# Patient Record
Sex: Female | Born: 1960 | Race: White | Hispanic: No | Smoking: Former smoker
Health system: Southern US, Community
[De-identification: ages and names within clinical notes are randomized; demographics above are authoritative.]

## PROBLEM LIST (undated history)

## (undated) VITALS — BP 115/80 | HR 106 | Temp 97.9°F | Resp 16 | Ht 64.0 in | Wt 154.0 lb

## (undated) DIAGNOSIS — A6920 Lyme disease, unspecified: Secondary | ICD-10-CM

## (undated) DIAGNOSIS — F988 Other specified behavioral and emotional disorders with onset usually occurring in childhood and adolescence: Secondary | ICD-10-CM

## (undated) DIAGNOSIS — Z808 Family history of malignant neoplasm of other organs or systems: Secondary | ICD-10-CM

## (undated) DIAGNOSIS — Z8042 Family history of malignant neoplasm of prostate: Secondary | ICD-10-CM

## (undated) DIAGNOSIS — F1010 Alcohol abuse, uncomplicated: Secondary | ICD-10-CM

## (undated) DIAGNOSIS — F319 Bipolar disorder, unspecified: Secondary | ICD-10-CM

## (undated) DIAGNOSIS — F329 Major depressive disorder, single episode, unspecified: Secondary | ICD-10-CM

## (undated) DIAGNOSIS — J329 Chronic sinusitis, unspecified: Secondary | ICD-10-CM

## (undated) DIAGNOSIS — F32A Depression, unspecified: Secondary | ICD-10-CM

## (undated) DIAGNOSIS — R7989 Other specified abnormal findings of blood chemistry: Secondary | ICD-10-CM

## (undated) DIAGNOSIS — D510 Vitamin B12 deficiency anemia due to intrinsic factor deficiency: Secondary | ICD-10-CM

## (undated) DIAGNOSIS — Z803 Family history of malignant neoplasm of breast: Secondary | ICD-10-CM

## (undated) DIAGNOSIS — Z800 Family history of malignant neoplasm of digestive organs: Secondary | ICD-10-CM

## (undated) DIAGNOSIS — Z780 Asymptomatic menopausal state: Secondary | ICD-10-CM

## (undated) HISTORY — PX: LAPAROSCOPIC APPENDECTOMY: SHX408

## (undated) HISTORY — PX: CHOLECYSTECTOMY: SHX55

## (undated) HISTORY — PX: KNEE SURGERY: SHX244

## (undated) HISTORY — DX: Family history of malignant neoplasm of breast: Z80.3

## (undated) HISTORY — DX: Family history of malignant neoplasm of other organs or systems: Z80.8

## (undated) HISTORY — DX: Family history of malignant neoplasm of prostate: Z80.42

## (undated) HISTORY — DX: Family history of malignant neoplasm of digestive organs: Z80.0

## (undated) HISTORY — DX: Bipolar disorder, unspecified: F31.9

---

## 2012-01-15 ENCOUNTER — Encounter (HOSPITAL_COMMUNITY): Payer: Self-pay | Admitting: *Deleted

## 2012-01-15 ENCOUNTER — Emergency Department (HOSPITAL_COMMUNITY)
Admission: EM | Admit: 2012-01-15 | Discharge: 2012-01-16 | Disposition: A | Discharge status: Other Acute Inpatient Hospital | Payer: BC Managed Care – HMO | Attending: Emergency Medicine | Admitting: Emergency Medicine

## 2012-01-15 DIAGNOSIS — F1010 Alcohol abuse, uncomplicated: Secondary | ICD-10-CM | POA: Insufficient documentation

## 2012-01-15 HISTORY — DX: Depression, unspecified: F32.A

## 2012-01-15 HISTORY — DX: Major depressive disorder, single episode, unspecified: F32.9

## 2012-01-15 HISTORY — DX: Other specified behavioral and emotional disorders with onset usually occurring in childhood and adolescence: F98.8

## 2012-01-15 HISTORY — DX: Lyme disease, unspecified: A69.20

## 2012-01-15 HISTORY — DX: Other specified abnormal findings of blood chemistry: R79.89

## 2012-01-15 HISTORY — DX: Vitamin B12 deficiency anemia due to intrinsic factor deficiency: D51.0

## 2012-01-15 HISTORY — DX: Alcohol abuse, uncomplicated: F10.10

## 2012-01-15 LAB — URINALYSIS, ROUTINE W REFLEX MICROSCOPIC
Ketones, ur: NEGATIVE mg/dL
Leukocytes, UA: NEGATIVE
Nitrite: NEGATIVE
Protein, ur: NEGATIVE mg/dL
Urobilinogen, UA: 0.2 mg/dL (ref 0.0–1.0)
pH: 6 (ref 5.0–8.0)

## 2012-01-15 LAB — CBC WITH DIFFERENTIAL/PLATELET
Basophils Absolute: 0 10*3/uL (ref 0.0–0.1)
Basophils Relative: 0 % (ref 0–1)
Eosinophils Absolute: 0.3 10*3/uL (ref 0.0–0.7)
Hemoglobin: 14.8 g/dL (ref 12.0–15.0)
MCH: 35.3 pg — ABNORMAL HIGH (ref 26.0–34.0)
MCHC: 35.7 g/dL (ref 30.0–36.0)
Neutro Abs: 5.4 10*3/uL (ref 1.7–7.7)
Neutrophils Relative %: 59 % (ref 43–77)
Platelets: 266 10*3/uL (ref 150–400)
RDW: 12.2 % (ref 11.5–15.5)

## 2012-01-15 LAB — COMPREHENSIVE METABOLIC PANEL
AST: 34 U/L (ref 0–37)
Albumin: 4.1 g/dL (ref 3.5–5.2)
Alkaline Phosphatase: 75 U/L (ref 39–117)
Chloride: 99 mEq/L (ref 96–112)
Potassium: 3.6 mEq/L (ref 3.5–5.1)
Total Bilirubin: 0.3 mg/dL (ref 0.3–1.2)
Total Protein: 7.5 g/dL (ref 6.0–8.3)

## 2012-01-15 LAB — ETHANOL: Alcohol, Ethyl (B): <11 mg/dL (ref 0–11)

## 2012-01-15 LAB — POCT PREGNANCY, URINE: Preg Test, Ur: NEGATIVE

## 2012-01-15 MED ORDER — ONDANSETRON HCL 4 MG PO TABS: 4.0000 mg | ORAL_TABLET | Freq: Three times a day (TID) | ORAL | Status: DC | PRN

## 2012-01-15 MED ORDER — NICOTINE 21 MG/24HR TD PT24
21.0000 mg | MEDICATED_PATCH | Freq: Every day | TRANSDERMAL | Status: DC
  Administered 2012-01-16: 21 mg via TRANSDERMAL
  Filled 2012-01-15: qty 1

## 2012-01-15 MED ORDER — IBUPROFEN 200 MG PO TABS
600.0000 mg | ORAL_TABLET | Freq: Three times a day (TID) | ORAL | Status: DC | PRN
  Administered 2012-01-16: 600 mg via ORAL
  Filled 2012-01-15: qty 3

## 2012-01-15 MED ORDER — ALUM & MAG HYDROXIDE-SIMETH 200-200-20 MG/5ML PO SUSP: 30.0000 mL | ORAL | Status: DC | PRN

## 2012-01-15 NOTE — ED Notes (Signed)
Pt states "here for detox from ETOH, drink 4-5 beers a day for a long time"

## 2012-01-15 NOTE — ED Provider Notes (Signed)
History     CSN: [id number]  Arrival date & time 01/15/12  1643   First MD Initiated Contact with Patient 01/15/12 [phone]      Chief Complaint  Patient presents with  . V70.1    (Consider location/radiation/quality/duration/timing/severity/associated sxs/prior treatment) The history is provided by the patient.   Patient here requesting detox from alcohol. Last drink was 24 hours ago. Denies abdominal pain or change in stools. No suicidal ideations. Denies any hallucinations. Has never been in rehabilitation before in the past. No other drug use. Nothing makes her symptoms better worse. No medications used prior to arrival Past Medical History  Diagnosis Date  . Depression   . Elevated testosterone level in female   . Pernicious anemia   . ADD (attention deficit disorder)   . ETOH abuse   . Lyme disease     Past Surgical History  Procedure Date  . Cholecystectomy   . Cesarean section   . Knee surgery     left    No family history on file.  History  Substance Use Topics  . Smoking status: Passive Smoker  . Smokeless tobacco: Not on file  . Alcohol Use: Not on file    OB History    Grav Para Term Preterm Abortions TAB SAB Ect Mult Living                  Review of Systems  All other systems reviewed and are negative.    Allergies  Penicillins  Home Medications   Current Outpatient Rx  Name Route Sig Dispense Refill  . ESCITALOPRAM OXALATE 10 MG PO TABS Oral Take 10 mg by mouth daily.    Marland Kitchen LISDEXAMFETAMINE DIMESYLATE 40 MG PO CAPS Oral Take 40 mg by mouth every morning.    Marland Kitchen VITAMIN B-12 1000 MCG PO TABS Oral Take 1,000 mcg by mouth.      BP 131/75  Pulse 90  Temp 98.3 F (36.8 C)  Resp 20  Wt 150 lb (68.04 kg)  SpO2 99%  Physical Exam  Nursing note and vitals reviewed. Constitutional: She is oriented to person, place, and time. Vital signs are normal. She appears well-developed and well-nourished.  Non-toxic appearance. No distress.  HENT:    Head: Normocephalic and atraumatic.  Eyes: Conjunctivae, EOM and lids are normal. Pupils are equal, round, and reactive to light.  Neck: Normal range of motion. Neck supple. No tracheal deviation present. No mass present.  Cardiovascular: Normal rate, regular rhythm and normal heart sounds.  Exam reveals no gallop.   No murmur heard. Pulmonary/Chest: Effort normal and breath sounds normal. No stridor. No respiratory distress. She has no decreased breath sounds. She has no wheezes. She has no rhonchi. She has no rales.  Abdominal: Soft. Normal appearance and bowel sounds are normal. She exhibits no distension. There is no tenderness. There is no rebound and no CVA tenderness.  Musculoskeletal: Normal range of motion. She exhibits no edema and no tenderness.  Neurological: She is alert and oriented to person, place, and time. She has normal strength. No cranial nerve deficit or sensory deficit. GCS eye subscore is 4. GCS verbal subscore is 5. GCS motor subscore is 6.  Skin: Skin is warm and dry. No abrasion and no rash noted.  Psychiatric: She has a normal mood and affect. Her speech is normal and behavior is normal. Thought content is not paranoid. She expresses no homicidal and no suicidal ideation.    ED Course  Procedures (including critical  care time)  Labs Reviewed  CBC WITH DIFFERENTIAL - Abnormal; Notable for the following:    MCH 35.3 (*)     All other components within normal limits  COMPREHENSIVE METABOLIC PANEL - Abnormal; Notable for the following:    Glucose, Bld 115 (*)     ALT 37 (*)     All other components within normal limits  URINALYSIS, ROUTINE W REFLEX MICROSCOPIC  ETHANOL  POCT PREGNANCY, URINE   No results found.   No diagnosis found.    MDM  Patient has been medically cleared at this time and will be evaluated by the act team        Toy Baker, MD 01/15/12 [phone]

## 2012-01-15 NOTE — BH Assessment (Signed)
Assessment Note   [name] is an 51 y.o. female. Pt reports that she is in town on vacation and her family performed an intervention today to confront her about her drinking.  Pt has multiple stressors: pending divorce, recent job loss.  Pt admits her drinking has increased significantly over the past month, currently 10 beers daily plus vodka.  Pt reports withdrawal of significant sweats.  Pt has not prior substance abuse treatment.  Pt also reports depression, currently sees therapist in [place].  Pt denies current SI, although while drinking at beach alone last week pt admits to some fleeting SI.  Pt denies HI/AV as well.  Axis I: Alcohol dependence Axis II: Deferred Axis III:  Past Medical History  Diagnosis Date  . Depression   . Elevated testosterone level in female   . Pernicious anemia   . ADD (attention deficit disorder)   . ETOH abuse   . Lyme disease    Axis IV: economic problems and problems with primary support group Axis V: 51-60 moderate symptoms  Past Medical History:  Past Medical History  Diagnosis Date  . Depression   . Elevated testosterone level in female   . Pernicious anemia   . ADD (attention deficit disorder)   . ETOH abuse   . Lyme disease     Past Surgical History  Procedure Date  . Cholecystectomy   . Cesarean section   . Knee surgery     left    Family History: No family history on file.  Social History:  reports that she has been passively smoking.  She does not have any smokeless tobacco history on file. She reports that she does not use illicit drugs. Her alcohol history not on file.  Additional Social History:  Alcohol / Drug Use Pain Medications: Pt denies Prescriptions: Pt denies Over the Counter: Pt denies History of alcohol / drug use?: Yes Longest period of sobriety (when/how long): none recent Negative Consequences of Use: Financial;Personal relationships Withdrawal Symptoms: Sweats Substance #1 Name of Substance  1: beer/liquor 1 - Age of First Use: 15 1 - Amount (size/oz): 10 beers/ 1/2 bottle vodka 1 - Frequency: daily 1 - Duration: Past month.  Pt reports 10 year history of daily drinking but has been drinking the above amount for the past month.  Prior to that was drinking less per day. 1 - Last Use / Amount: 7/27, 4 24 oz beers, 1 drink  CIWA: CIWA-Ar BP: 118/80 mmHg Pulse Rate: 74  Nausea and Vomiting: no nausea and no vomiting Tactile Disturbances: none Tremor: no tremor Auditory Disturbances: not present Paroxysmal Sweats: beads of sweat obvious on forehead Visual Disturbances: not present Anxiety: no anxiety, at ease Headache, Fullness in Head: mild Agitation: somewhat more than normal activity Orientation and Clouding of Sensorium: oriented and can do serial additions CIWA-Ar Total: 7  COWS:    Allergies:  Allergies  Allergen Reactions  . Penicillins Anaphylaxis    Home Medications:  (Not in a hospital admission)  OB/GYN Status:  No LMP recorded. Patient is not currently having periods (Reason: Perimenopausal).  General Assessment Data Location of Assessment: [facility] ACT Assessment: Yes Living Arrangements: Spouse/significant other;Children;Non-relatives/Friends Can pt return to current living arrangement?: Yes  Education Status Is patient currently in school?: No  Risk to self Suicidal Ideation: No-Not Currently/Within Last 6 Months (Pt reports SI, ideation only, last week at beach, no plan.) Suicidal Intent: No Is patient at risk for suicide?: No Suicidal Plan?: No Access to Means:  No What has been your use of drugs/alcohol within the last 12 months?: current daily drinker Previous Attempts/Gestures: No Intentional Self Injurious Behavior: None Family Suicide History: Yes (mother's side) Recent stressful life event(s): Divorce;Job Loss;Financial Problems Persecutory voices/beliefs?: No Depression: Yes Depression Symptoms:  Despondent;Isolating;Fatigue;Guilt;Loss of interest in usual pleasures;Feeling worthless/self pity;Feeling angry/irritable Substance abuse history and/or treatment for substance abuse?: Yes Suicide prevention information given to non-admitted patients: Not applicable  Risk to Others Homicidal Ideation: No Thoughts of Harm to Others: No Current Homicidal Intent: No Current Homicidal Plan: No Access to Homicidal Means: No History of harm to others?: No Assessment of Violence: None Noted Does patient have access to weapons?: No Criminal Charges Pending?: No Does patient have a court date: No  Psychosis Hallucinations: None noted Delusions: None noted  Mental Status Report Appear/Hygiene: Other (Comment) (casual) Eye Contact: Good Motor Activity: Unremarkable Speech: Logical/coherent Level of Consciousness: Alert Mood: Other (Comment) (cooperatiove) Affect: Appropriate to circumstance Anxiety Level: Minimal Thought Processes: Coherent;Relevant Judgement: Unimpaired Orientation: Person;Place;Time;Situation Obsessive Compulsive Thoughts/Behaviors: None  Cognitive Functioning Concentration: Normal Memory: Recent Intact;Remote Intact IQ: Average Insight: Good Impulse Control: Poor Appetite: Poor Weight Loss: 5  Weight Gain: 0  Sleep: No Change Total Hours of Sleep: 5  Vegetative Symptoms: None  ADLScreening [facility]) Patient's cognitive ability adequate to safely complete daily activities?: Yes Patient able to express need for assistance with ADLs?: Yes Independently performs ADLs?: Yes  Abuse/Neglect [facility]) Physical Abuse: Denies Verbal Abuse: Denies Sexual Abuse: Denies  Prior Inpatient Therapy Prior Inpatient Therapy: Yes Prior Therapy Dates: 2007 Prior Therapy Facilty/Provider(s): [facility] Reason for Treatment: psych  Prior Outpatient Therapy Prior Outpatient Therapy: Yes Prior Therapy Dates: current Prior Therapy  Facilty/Provider(s): [facility] Reason for Treatment: depression  ADL Screening (condition at time of admission) Patient's cognitive ability adequate to safely complete daily activities?: Yes Patient able to express need for assistance with ADLs?: Yes Independently performs ADLs?: Yes Weakness of Legs: None Weakness of Arms/Hands: None  Home Assistive Devices/Equipment Home Assistive Devices/Equipment: None    Abuse/Neglect Assessment (Assessment to be complete while patient is alone) Physical Abuse: Denies Verbal Abuse: Denies Sexual Abuse: Denies Exploitation of patient/patient's resources: Denies Self-Neglect: Denies Values / Beliefs Cultural Requests During Hospitalization: None Spiritual Requests During Hospitalization: None   Advance Directives (For Healthcare) Advance Directive: Patient does not have advance directive;Patient would not like information    Additional Information 1:1 In Past 12 Months?: No CIRT Risk: No Elopement Risk: No Does patient have medical clearance?: Yes     Disposition:     On Site Evaluation by:   Reviewed with Physician:     Lorri Frederick 01/15/2012 10:10 PM

## 2012-01-16 ENCOUNTER — Inpatient Hospital Stay (HOSPITAL_COMMUNITY)
Admission: RE | Admit: 2012-01-16 | Discharge: 2012-01-20 | DRG: 750 | Disposition: A | Discharge status: Home / Self Care | Payer: BC Managed Care – HMO | Attending: Psychiatry | Admitting: Psychiatry

## 2012-01-16 ENCOUNTER — Encounter (HOSPITAL_COMMUNITY): Payer: Self-pay | Admitting: *Deleted

## 2012-01-16 DIAGNOSIS — D510 Vitamin B12 deficiency anemia due to intrinsic factor deficiency: Secondary | ICD-10-CM | POA: Diagnosis present

## 2012-01-16 DIAGNOSIS — F988 Other specified behavioral and emotional disorders with onset usually occurring in childhood and adolescence: Secondary | ICD-10-CM | POA: Diagnosis present

## 2012-01-16 DIAGNOSIS — F1020 Alcohol dependence, uncomplicated: Secondary | ICD-10-CM | POA: Diagnosis present

## 2012-01-16 DIAGNOSIS — F10939 Alcohol use, unspecified with withdrawal, unspecified: Principal | ICD-10-CM | POA: Diagnosis not present

## 2012-01-16 DIAGNOSIS — F329 Major depressive disorder, single episode, unspecified: Secondary | ICD-10-CM | POA: Diagnosis present

## 2012-01-16 DIAGNOSIS — F10239 Alcohol dependence with withdrawal, unspecified: Principal | ICD-10-CM | POA: Diagnosis not present

## 2012-01-16 DIAGNOSIS — Z79899 Other long term (current) drug therapy: Secondary | ICD-10-CM

## 2012-01-16 DIAGNOSIS — F3289 Other specified depressive episodes: Secondary | ICD-10-CM | POA: Diagnosis present

## 2012-01-16 LAB — RAPID URINE DRUG SCREEN, HOSP PERFORMED
Amphetamines: NOT DETECTED
Barbiturates: NOT DETECTED
Benzodiazepines: NOT DETECTED
Cocaine: NOT DETECTED

## 2012-01-16 MED ORDER — CHLORDIAZEPOXIDE HCL 25 MG PO CAPS: 25.0000 mg | ORAL_CAPSULE | Freq: Four times a day (QID) | ORAL | Status: AC | PRN

## 2012-01-16 MED ORDER — HYDROXYZINE HCL 25 MG PO TABS: 25.0000 mg | ORAL_TABLET | Freq: Four times a day (QID) | ORAL | Status: AC | PRN

## 2012-01-16 MED ORDER — LOPERAMIDE HCL 2 MG PO CAPS: 2.0000 mg | ORAL_CAPSULE | ORAL | Status: AC | PRN

## 2012-01-16 MED ORDER — CHLORDIAZEPOXIDE HCL 25 MG PO CAPS
25.0000 mg | ORAL_CAPSULE | Freq: Three times a day (TID) | ORAL | Status: AC
  Administered 2012-01-18 (×3): 25 mg via ORAL
  Filled 2012-01-16 (×3): qty 1

## 2012-01-16 MED ORDER — VITAMIN B-1 100 MG PO TABS
100.0000 mg | ORAL_TABLET | Freq: Every day | ORAL | Status: DC
  Administered 2012-01-17 – 2012-01-20 (×4): 100 mg via ORAL
  Filled 2012-01-16 (×6): qty 1

## 2012-01-16 MED ORDER — ALUM & MAG HYDROXIDE-SIMETH 200-200-20 MG/5ML PO SUSP: 30.0000 mL | ORAL | Status: DC | PRN

## 2012-01-16 MED ORDER — CHLORDIAZEPOXIDE HCL 25 MG PO CAPS: 25.0000 mg | ORAL_CAPSULE | Freq: Every day | ORAL | Status: DC

## 2012-01-16 MED ORDER — ACETAMINOPHEN 325 MG PO TABS: 650.0000 mg | ORAL_TABLET | Freq: Four times a day (QID) | ORAL | Status: DC | PRN

## 2012-01-16 MED ORDER — TRAZODONE HCL 100 MG PO TABS
100.0000 mg | ORAL_TABLET | Freq: Every day | ORAL | Status: DC
  Administered 2012-01-16 – 2012-01-19 (×4): 100 mg via ORAL
  Filled 2012-01-16 (×9): qty 1

## 2012-01-16 MED ORDER — NICOTINE 21 MG/24HR TD PT24
21.0000 mg | MEDICATED_PATCH | Freq: Every day | TRANSDERMAL | Status: DC
  Administered 2012-01-17 – 2012-01-20 (×3): 21 mg via TRANSDERMAL
  Filled 2012-01-16 (×7): qty 1

## 2012-01-16 MED ORDER — THIAMINE HCL 100 MG/ML IJ SOLN
100.0000 mg | Freq: Once | INTRAMUSCULAR | Status: AC
  Administered 2012-01-16: 100 mg via INTRAMUSCULAR

## 2012-01-16 MED ORDER — CHLORDIAZEPOXIDE HCL 25 MG PO CAPS
25.0000 mg | ORAL_CAPSULE | Freq: Four times a day (QID) | ORAL | Status: AC
  Administered 2012-01-16 – 2012-01-17 (×6): 25 mg via ORAL
  Filled 2012-01-16 (×6): qty 1

## 2012-01-16 MED ORDER — ADULT MULTIVITAMIN W/MINERALS CH
1.0000 | ORAL_TABLET | Freq: Every day | ORAL | Status: DC
  Administered 2012-01-16 – 2012-01-20 (×5): 1 via ORAL
  Filled 2012-01-16 (×7): qty 1

## 2012-01-16 MED ORDER — MAGNESIUM HYDROXIDE 400 MG/5ML PO SUSP: 30.0000 mL | Freq: Every day | ORAL | Status: DC | PRN

## 2012-01-16 MED ORDER — CHLORDIAZEPOXIDE HCL 25 MG PO CAPS: 25.0000 mg | ORAL_CAPSULE | Freq: Once | ORAL | Status: DC

## 2012-01-16 MED ORDER — ONDANSETRON 4 MG PO TBDP: 4.0000 mg | ORAL_TABLET | Freq: Four times a day (QID) | ORAL | Status: AC | PRN

## 2012-01-16 MED ORDER — CHLORDIAZEPOXIDE HCL 25 MG PO CAPS
25.0000 mg | ORAL_CAPSULE | ORAL | Status: DC
  Administered 2012-01-19: 25 mg via ORAL
  Filled 2012-01-16: qty 1

## 2012-01-16 NOTE — ED Notes (Signed)
Patient states she is from [place]; willing to seek treatment there; sees a therapist and has a PCP who she sees frequently; pt wants helps and desires to seek therapy

## 2012-01-16 NOTE — ED Notes (Signed)
Patient transferred to behavorial health via security; report called to adult unit; pt stable at time of transfer; all paperwork and belongings with patient;

## 2012-01-16 NOTE — BHH Counselor (Signed)
Patient has been accepted by Dr. Theotis Barrio to the services of Dr. Koren Shiver pending bed availability.

## 2012-01-16 NOTE — ED Provider Notes (Addendum)
Patient alert Glasgow Coma Score 15 ambulate without difficulty pleasant and cooperative stable for transfer to behavioral health hospital  Doug Sou, MD 01/16/12 1605and  Doug Sou, MD 01/16/12 [phone]

## 2012-01-16 NOTE — ED Notes (Signed)
Patient is resting comfortably. 

## 2012-01-16 NOTE — Progress Notes (Addendum)
Admission Note:   Patient's first admission to [facility].   Denied SI and HI.  Denied A/V hallucinations.   Denied pain at this time.  Stated she does feel anxiety and has occasional sweating episodes.  Stated her husband left her 2 years ago.   Has two sons, age 51 and 98 yrs old.   Oldest son goes to [employer] , [place].  Youngest son goes to [employer].   Husband does provide insurance and support funds.  Contributes some monies to sons'  Education.  Patient denied using drugs.  Stated she has been drinking approximately 10 beers and one bottle vodka daily.   Has her own home in [place], [place] and came to visit sister in [place] who encourage patient to come to [facility] for help.  Stated she lost her job 3 years ago and is over-qualified for work.  Made six figure salary in [employer].  Older son attempted suicide in the past year and had major surgery 2 years ago.  Younger son has health issues. Stated she takes lexapro 10 mg daily, but not in the past month.  Also takes estrogen but not in the past month.  Smokes approximately one pack cigarettes daily recently, feels she may need nicotine patch.  Has small white circles on legs/arm caused by sun.  Uncle has history of alcohol abuse.  Stated she takes B12 injections monthly, last injection 3 weeks ago.  Stated her OBGYN told her she is not fertile because of pituary gland problems.    [address] [employer] has clothes and phone. Patient was given dinner.  Oriented to unit.  Patient has been cooperative and pleasant.

## 2012-01-17 DIAGNOSIS — F1020 Alcohol dependence, uncomplicated: Secondary | ICD-10-CM | POA: Diagnosis present

## 2012-01-17 DIAGNOSIS — F329 Major depressive disorder, single episode, unspecified: Secondary | ICD-10-CM

## 2012-01-17 MED ORDER — ESCITALOPRAM OXALATE 10 MG PO TABS
10.0000 mg | ORAL_TABLET | Freq: Every day | ORAL | Status: DC
  Administered 2012-01-17 – 2012-01-20 (×4): 10 mg via ORAL
  Filled 2012-01-17 (×6): qty 1

## 2012-01-17 NOTE — Progress Notes (Signed)
BHH Group Notes:  (Counselor/Nursing/MHT/Case Management/Adjunct)  01/17/2012 3:27 PM  Type of Therapy:  Psychoeducational Skills  Participation Level:  Active  Participation Quality:  Appropriate, Attentive and Sharing  Affect:  Appropriate  Cognitive:  Alert, Appropriate and Oriented  Insight:  Good  Engagement in Group:  Good  Engagement in Therapy:  n/a  Modes of Intervention:  Activity, Education, Problem-solving, Socialization and Support  Summary of Progress/Problems: [name] attended Psychoeducational group on labels. [name] was late due to consult and did not participate in an activity labeling self and peers. [name] was active while group discussed what labels are, how we use them, and listed positive and negative labels they have used or been called. [name] stated she thinks and does too much for others and does not take care of herself. [name] was given a homework assignment to list 10 words she has been labeled to find the reality of the situation/label.    Wandra Scot 01/17/2012, 3:27 PM

## 2012-01-17 NOTE — Progress Notes (Signed)
BHH Group Notes:  (Counselor/Nursing/MHT/Case Management/Adjunct)  01/17/2012 4:48 PM  Type of Therapy:  Group Therapy at 1:15 to 2:30  Participation Level:  Active  Participation Quality:  Appropriate, Attentive and Sharing  Affect:  Appropriate  Cognitive:  Alert, Appropriate and Oriented  Insight:  Good  Engagement in Group:  Good  Engagement in Therapy:  Limited  Modes of Intervention:  Education, Paediatric nurse of Progress/Problems:Patient attended group presentation by Interior and spatial designer of  [employer] [place] ([employer]). Lanora Manis [facility]) was attentive and appropriate during session.  She shared how this same type service would have helped her immensely to perhaps avoid hospitalization.    Clide Dales

## 2012-01-17 NOTE — BH Assessment (Signed)
Case Management: Patient has home to return to in [place], [place], has transportation and access to medications. Patient admits that she would benefit from SA treatment. Writer called and spoke with CD-IOP facilitator, Charmian Muff, and requested that she speak with patient on 01/18/2012 to assess for readiness for CD-IOP services. Patient received Recovery Workbook in after-care planning group this AM. No further case management needs voiced at this time. Joice Lofts RN MS EdS 01/17/2012  3:55 PM

## 2012-01-17 NOTE — H&P (Signed)
Psychiatric Admission Assessment Adult  Patient Identification:  [name] Date of Evaluation:  01/17/2012 Chief Complaint:  Alcohol Dependence History of Present Illness: Patient states she is here voluntarily after she had an epiphany that she needed help with her alcohol problem.  She has been drinking 18 beers a day and a bottle of vodka a day. For the last 2 months.  She denies other drugs or substances of abuse. She states she started having increased drinking after her husband left her 2 years ago. She lives in [place] and has family in this area.  She will return to [place] upon discharge.  Her W/D symptoms are minimal.  She states her mood symptoms are depression severe with no psychosis, no SI/HI, no AH/VH.  No anxiety, just feels that she is ready to get on with her life.  Past Medical History:   Past Medical History  Diagnosis Date  . Depression   . Elevated testosterone level in female   . Pernicious anemia   . ADD (attention deficit disorder)   . ETOH abuse   . Lyme disease    None. Allergies:   Allergies  Allergen Reactions  . Penicillins Anaphylaxis   PTA Medications: Prescriptions prior to admission  Medication Sig Dispense Refill  . escitalopram (LEXAPRO) 10 MG tablet Take 10 mg by mouth daily.      Marland Kitchen lisdexamfetamine (VYVANSE) 40 MG capsule Take 40 mg by mouth every morning.      . vitamin B-12 (CYANOCOBALAMIN) 1000 MCG tablet Take 1,000 mcg by mouth.        Previous Psychotropic Medications:  Medication/Dose   Lexapro    Vyvanse   Angelique (HRT)   B12 injections q 3 weeks          Substance Abuse History in the last 12 months: Substance Age of 1st Use Last Use Amount Specific Type  Nicotine      Alcohol   see HPI    Cannabis      Opiates      Cocaine      Methamphetamines      LSD      Ecstasy      Benzodiazepines      Caffeine      Inhalants      Others:                         Consequences of Substance Abuse: Medical  Consequences:  None  Social History: Current Place of Residence:   Place of Birth:   Family Members: Marital Status:  Divorced Children:  Sons:  Daughters: Relationships: Education:  BS Educational Problems/Performance: Religious Beliefs/Practices: History of Abuse (Emotional/Phsycial/Sexual) Occupational Experiences; Military History:  None. Legal History: Hobbies/Interests:  Family History:  History reviewed. No pertinent family history. ROS: Negative with the exception of the HPI. PE: Completed by the MD in the ED. Mental Status Examination/Evaluation: Objective:  Appearance: Fairly Groomed  Patent attorney::  Good  Speech:  Clear and Coherent  Volume:  Normal  Mood:  Depressed  Affect:  Appropriate  Thought Process:  Coherent, Goal Directed and Intact  Orientation:  Full  Thought Content:  WDL  Suicidal Thoughts:  No  Homicidal Thoughts:  No  Memory:  Immediate;   Good  Judgement:  Good  Insight:  Present  Psychomotor Activity:  Normal  Concentration:  Good  Recall:  Good  Akathisia:  No  Handed:  Right  AIMS (if indicated):     Assets:  Communication Skills  Sleep:  Number of Hours: 6.5     Laboratory/X-Ray Psychological Evaluation(s)      Assessment:    AXIS I:  Alcohol dependence with MDD AXIS II:  Deferred AXIS III:   Past Medical History  Diagnosis Date  . Depression   . Elevated testosterone level in female   . Pernicious anemia   . ADD (attention deficit disorder)   . ETOH abuse   . Lyme disease    AXIS IV:  problems with primary support group AXIS V:  51-60 moderate symptoms  Treatment Plan: 1. Admit for stabilization and crisis management. 2. Continue Librium protocol. 3. Consider SSRI.   Treatment Plan Summary: 1. Admit for crisis management and stabilization. 2. Detox with standard (Librium/Clonidine) protocol. 3. Treat health problems as indicated. 4. Develop treatment plan to decrease risk of relapse upon discharge and the  need for readmission. 5. Psycho-social education regarding relapse prevention and self care. 6. Health care follow up as needed for medical problems.  Current Medications:  Current Facility-Administered Medications  Medication Dose Route Frequency Provider Last Rate Last Dose  . acetaminophen (TYLENOL) tablet 650 mg  650 mg Oral Q6H PRN Sanjuana Kava, NP      . alum & mag hydroxide-simeth (MAALOX/MYLANTA) 200-200-20 MG/5ML suspension 30 mL  30 mL Oral Q4H PRN Sanjuana Kava, NP      . chlordiazePOXIDE (LIBRIUM) capsule 25 mg  25 mg Oral Q6H PRN Sanjuana Kava, NP      . chlordiazePOXIDE (LIBRIUM) capsule 25 mg  25 mg Oral Once Sanjuana Kava, NP      . chlordiazePOXIDE (LIBRIUM) capsule 25 mg  25 mg Oral QID Sanjuana Kava, NP   25 mg at 01/17/12 1504   Followed by  . chlordiazePOXIDE (LIBRIUM) capsule 25 mg  25 mg Oral TID Sanjuana Kava, NP       Followed by  . chlordiazePOXIDE (LIBRIUM) capsule 25 mg  25 mg Oral BH-qamhs Sanjuana Kava, NP       Followed by  . chlordiazePOXIDE (LIBRIUM) capsule 25 mg  25 mg Oral Daily Sanjuana Kava, NP      . hydrOXYzine (ATARAX/VISTARIL) tablet 25 mg  25 mg Oral Q6H PRN Sanjuana Kava, NP      . loperamide (IMODIUM) capsule 2-4 mg  2-4 mg Oral PRN Sanjuana Kava, NP      . magnesium hydroxide (MILK OF MAGNESIA) suspension 30 mL  30 mL Oral Daily PRN Sanjuana Kava, NP      . multivitamin with minerals tablet 1 tablet  1 tablet Oral Daily Sanjuana Kava, NP   1 tablet at 01/17/12 0940  . nicotine (NICODERM CQ - dosed in mg/24 hours) patch 21 mg  21 mg Transdermal Q0600 Sanjuana Kava, NP   21 mg at 01/17/12 1610  . ondansetron (ZOFRAN-ODT) disintegrating tablet 4 mg  4 mg Oral Q6H PRN Sanjuana Kava, NP      . thiamine (B-1) injection 100 mg  100 mg Intramuscular Once Sanjuana Kava, NP   100 mg at 01/16/12 1832  . thiamine (VITAMIN B-1) tablet 100 mg  100 mg Oral Daily Sanjuana Kava, NP   100 mg at 01/17/12 0941  . traZODone (DESYREL) tablet 100 mg  100 mg Oral QHS  Sanjuana Kava, NP   100 mg at 01/16/12 2159   Facility-Administered Medications Ordered in Other Encounters  Medication Dose Route Frequency Provider Last Rate  Last Dose  . DISCONTD: alum & mag hydroxide-simeth (MAALOX/MYLANTA) 200-200-20 MG/5ML suspension 30 mL  30 mL Oral PRN Toy Baker, MD      . DISCONTD: ibuprofen (ADVIL,MOTRIN) tablet 600 mg  600 mg Oral Q8H PRN Toy Baker, MD   600 mg at 01/16/12 0846  . DISCONTD: nicotine (NICODERM CQ - dosed in mg/24 hours) patch 21 mg  21 mg Transdermal Daily Toy Baker, MD   21 mg at 01/16/12 0951  . DISCONTD: ondansetron (ZOFRAN) tablet 4 mg  4 mg Oral Q8H PRN Toy Baker, MD        Observation Level/Precautions:  Detox  Laboratory:    Psychotherapy:    Medications:    Routine PRN Medications:  Yes  Consultations:    Discharge Concerns:    Other:     Lloyd Huger T. Jameon Deller [facility] 01/18/2012

## 2012-01-17 NOTE — Progress Notes (Signed)
D: Patient denies SI/HI. Patient reports that she slept well; appetite is good and energy level is normal; reports that ability to pay attention is good; reports depression as 5/10 and hopelessness as 5/10; reports sweating and some anxiety but no other withdrawal symptoms at this time; reports a headache 1/10 and is decreasing and patient described it as a dull patient A: monitor q 15 minutes; offered emotional support and made patient aware that if symptoms increase please express with RN; informed patient to come to staff to express any feelings and/or concerns and encouraged to attend groups R: Patient is pleasant and cooperative; patient is assertive about treatment; patient is taking medications and tolerating medications well
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
## 2012-01-17 NOTE — Progress Notes (Signed)
[facility] Adult Inpatient Family/Significant Other Suicide Prevention Education  Suicide Prevention Education:  Education Completed; Sister, Burna Mortimer and [phone] has been identified by the patient as the family member/significant other with whom the patient will be residing, and identified as the person(s) who will aid the patient in the event of a mental health crisis (suicidal ideations/suicide attempt).  With written consent from the patient, the family member/significant other has been provided the following suicide prevention education, prior to the and/or following the discharge of the patient.  The suicide prevention education provided includes the following:  Suicide risk factors  Suicide prevention and interventions  National Suicide Hotline telephone number  [facility] assessment telephone number  [facility] Emergency Assistance 911  [facility] and/or Residential Mobile Crisis Unit telephone number  Request made of family/significant other to:  Remove weapons (e.g., guns, rifles, knives), all items previously/currently identified as safety concern.    Remove drugs/medications (over-the-counter, prescriptions, illicit drugs), all items previously/currently identified as a safety concern.  Sister Drinda Butts states there are no firearms nor narcotics in here home here in [place] where patient is likely to stay once she is discharged.   The family member/significant other verbalizes understanding of the suicide prevention education information provided.  The family member/significant other agrees to remove the items of safety concern listed above.  Clide Dales 01/17/2012, 8:05 PM

## 2012-01-17 NOTE — Progress Notes (Signed)
[facility] Group Notes:  (Counselor/Nursing/MHT/Case Management/Adjunct)  01/17/2012 2:07 PM  Type of Therapy:  Psychoeducational Skills  Participation Level:  Active  Participation Quality:  Appropriate  Affect:  Appropriate  Cognitive:  Appropriate  Insight:  Good  Engagement in Group:  Good  Engagement in Therapy:  Good  Modes of Intervention:  Support  Summary of Progress/Problems: Pt attended the 1100 Group and was active in the discussion about recovery goals. Pt stated that her own personal recovery goals included figuring out who she is without her recently divorced husband and lost job. Pt stated that she needed to reinvent who she is across the next year and spend more time helping herself rather than others.   Christ Kick 01/17/2012, 2:07 PM

## 2012-01-17 NOTE — [facility] Suicide Risk Assessment (Signed)
Suicide Risk Assessment  Admission Assessment     Demographic factors:  See chart.  Current Mental Status: Patient seen and evaluated. Chart reviewed. Patient stated that her mood was "ok". Her affect was mood congruent and euthymic. She denied any current thoughts of self injurious behavior, suicidal ideation or homicidal ideation. There were no auditory or visual hallucinations, paranoia, delusional thought processes, or mania noted.  Thought process was linear and goal directed.  No psychomotor agitation or retardation was noted. Speech was normal rate, tone and volume. Eye contact was good. Judgment and insight are fair.  Patient has been up and engaged on the unit.  No acute safety concerns reported from team.  No sig withdrawal s/s noted at this time with use of standard librium taper.   Loss Factors:  Decrease in vocational status;Loss of significant relationship;Decline in physical health;Financial problems / change in socioeconomic status; lost job  Historical Factors: Family history of suicide;Family history of mental illness or substance abuse;Anniversary of important loss; vague thoughts of death in past; no attempts/plans/current SI  Risk Reduction Factors: Sense of responsibility to family;Religious beliefs about death;Living with another person, especially a relative;Positive social support; open to residential tx  CLINICAL FACTORS: Alcohol Use & W/D Disorder; Depressive Disorder Unspecified   COGNITIVE FEATURES THAT CONTRIBUTE TO RISK: none.  SUICIDE RISK: Pt viewed as a chronic moderate increased risk of harm to self in light of her past hx and risk factors.  No acute safety concerns on the unit.  Pt contracting for safety and in need of crisis stabilization & Tx.  PLAN OF CARE: Pt admitted for crisis stabilization, detox off alcohol and treatment.  Please see orders. Restarted Lexapro that pt views as helpful and trazodone for sleep.  Medications reviewed with pt and medication  education provided. Will continue q15 minute checks per unit protocol.  No clinical indication for one on one level of observation at this time.  Pt contracting for safety.  Mental health treatment, medication management and continued sobriety will mitigate against the increased risk of harm to self and/or others.  Discussed the importance of recovery with pt, as well as, tools to move forward in a healthy & safe manner.  Pt agreeable with the plan.  Discussed with the team. Pt open to residential Tx.  Lupe Carney 01/17/2012, 4:57 PM

## 2012-01-17 NOTE — Progress Notes (Signed)
01/17/2012      Time: 1500      Group Topic/Focus: The focus of this group is on discussing various styles of communication and communicating assertively using 'I' (feeling) statements.  Participation Level: Active  Participation Quality: Appropriate and Attentive  Affect: Appropriate  Cognitive: Alert   Additional Comments: None.   Rilda Bulls 01/17/2012 4:36 PM

## 2012-01-17 NOTE — Progress Notes (Signed)
Patient ID: [name], female   DOB: 31-Mar-1961, 51 y.o.   MRN: [id number]  D:  Patient was pleasant and cooperative during the assessment. Stated she'd been overwhelmed for "some time" with all things "adding up".  Pt spoke of her estranged husband, lost of employment and situations surrounding her sons.  Stated she was encouraged by her son to receive help after she started drinking "during the day time".   A:  Encouragement and support was offered. 15 min checks were continued.  R:  Pt remains safe.

## 2012-01-18 DIAGNOSIS — F10239 Alcohol dependence with withdrawal, unspecified: Principal | ICD-10-CM

## 2012-01-18 DIAGNOSIS — F1020 Alcohol dependence, uncomplicated: Secondary | ICD-10-CM

## 2012-01-18 DIAGNOSIS — F329 Major depressive disorder, single episode, unspecified: Secondary | ICD-10-CM

## 2012-01-18 MED ORDER — CYANOCOBALAMIN 1000 MCG/ML IJ SOLN
1000.0000 ug | Freq: Once | INTRAMUSCULAR | Status: AC
  Administered 2012-01-18: 1000 ug via INTRAMUSCULAR
  Filled 2012-01-18 (×2): qty 1

## 2012-01-18 NOTE — Progress Notes (Signed)
[facility] Group Notes:  (Counselor/Nursing/MHT/Case Management/Adjunct)  01/18/2012 10:26 PM  Type of Therapy:  Psychoeducational Skills  Participation Level:  Active  Participation Quality:  Supportive  Affect:  Appropriate  Cognitive:  Appropriate  Insight:  Good  Engagement in Group:  Good  Engagement in Therapy:  Good  Modes of Intervention:  Support  Summary of Progress/Problems:   Christ Kick 01/18/2012, 10:26 PM

## 2012-01-18 NOTE — Care Management (Addendum)
Patient requested that writer seek treatment bed at [facility]. Patient spoke with Charmian Muff from [facility] and felt that she needed a more structured treatment option. Release of Information signed and placed in shadow chart for [facility] and clinicals faxed to Rose[facility] Admissions Clinician.Rates depression, anxiety and hopelessness at 8/5/5. Joice Lofts RN MS EdS 01/18/2012  11:30 AM

## 2012-01-18 NOTE — Progress Notes (Signed)
D: Patient denies SI/HI. Patient reported that her sleep was well and she received trazadone last night; reports her appetite is good;energy level is normal; ability to pay attention is improving;  patient rates depression 3/10 and hopelessness 5/10; and reports anxiety at 5/10; no withdrawal symptoms at this time but some lightheadedness and dizziness and bp was in the high 90's this morning and patient received trazodone for the first time on 01/17/12 A: monitor q 15 minutes; emotional support offered; fluids encouraged and bp reassessed; informed patient to come to staff to express any feelings and/or concerns and encouraged to attend groups; educated about orthostatic hypotension R: Patient is cooperative;patient is taking medications; patient is responding well to medications as of now; bp was 117/81 sitting and 104/69; patient is assertive and is attending all groups

## 2012-01-18 NOTE — Progress Notes (Signed)
[facility] Group Notes:  (Counselor/Nursing/MHT/Case Management/Adjunct)  01/18/2012 4:20 PM  Type of Therapy:  Group Therapy from 1:15 to 2:30   Participation Level:  Did Not Attend   Clide Dales 01/18/2012, 4:20 PM

## 2012-01-18 NOTE — Progress Notes (Signed)
Interdisciplinary Treatment Plan Update (Adult)  Date:  01/18/2012 Time Reviewed:  10:44 AM  Progress in Treatment: Attending groups: Yes. Participating in groups:  Yes. Taking medication as prescribed:  Yes. Tolerating medication:  No. and As evidenced by:  patient unsteady on her feet and states she feels high. Nursing staff attributed feeling to increase in Trazadone, patient attributes to addition of Lexapro Family/Significant othe contact made:  Yes, individual(s) contacted:  Burna Mortimer, sister Patient understands diagnosis:  Yes. Discussing patient identified problems/goals with staff:  Yes. Medical problems stabilized or resolved:  Yes. Denies suicidal/homicidal ideation: Yes. Issues/concerns per patient self-inventory:  No. Other:  New problem(s) identified: None; patient is contemplating choice between intensive outpatient [facility] and inpatient treatment at [facility]  Reason for Continuation of Hospitalization: Medical Issues Medication stabilization  Interventions implemented related to continuation of hospitalization: Continuation of detox protocol; medication evaluation of sleep medication   Additional comments: NA  Estimated length of stay: 1-2 days  Discharge Plan: [facility] or [facility]  New goal(s): NA  Review of initial/current patient goals per problem list:   1.  Goal(s): Complete Detox Protocol  Met:  No  Target date: Discharge  As evidenced by: Completion of detox protocol  2.  Goal (s): Depression  Met:  No  Target date: Discharge  As evidenced by: lessening of depressive symptoms  Attendees: Patient:  [name] 7/31/201310:44 AM  Family:   7/31/201310:44 AM  Physician:  Ian Bushman 7/31/201310:44 AM  Nursing:   Carolynn Comment, RN 7/31/201310:44 AM  Case Manager:  Barrie Folk, RN 7/31/201310:44 AM  Counselor:  Ronda Fairly LCSWA 7/31/201310:44 AM  Other:  Robbie Louis, RN 7/31/201310:44 AM  Other:    7/31/201310:44 AM  Other:   7/31/201310:44 AM  Other:  7/31/201310:44 AM  Other:  7/31/201310:44 AM  Other:  7/31/201310:44 AM  Other:  7/31/201310:44 AM  Other:  7/31/201310:44 AM  Other:  7/31/201310:44 AM  Other:   7/31/201310:44 AM   Scribe for Treatment Team:   Clide Dales, 01/18/2012, 10:44 AM

## 2012-01-18 NOTE — Progress Notes (Signed)
[facility] MD Progress Note                                         01/18/2012    [name] 1960-12-29    [id number] Hospital day #2  [id number] Use & W/D Disorder; Depressive Disorder Unspecified   The patient was seen today and reports the following:  Sleep: "the best ever last night." Appetite: wonderful!!  Mild>(1-10) >Severe  Hopelessness (1-10): 5/10 Depression (1-10): 3-5/10 Anxiety (1-10):   3-5/10 Suicidal Ideation: .none Plan: None Intent: None Means:  None Homicidal Ideation: none Plan: None Intent: None Means: None  Eye Contact: Good.  General Appearance: groomed Behavior:  cooperative Motor Behavior: normal Speech:  normal  Mental Status:  Oriented x3 Level of Consciousness:  Awake and alert Mood: euthymic Affect:congruent  Thought Process: normal/linear Thought Content: normal Perception: intact  Judgment: intact Insight: present Cognition:average  Sleep: Number of Hours:   Filed Vitals:   01/18/12 1631  BP: 102/68  Pulse: 86  Temp: 97.9 F (36.6 C)  Resp: 18       . chlordiazePOXIDE  25 mg Oral Once  . chlordiazePOXIDE  25 mg Oral QID   Followed by  . chlordiazePOXIDE  25 mg Oral TID   Followed by  . chlordiazePOXIDE  25 mg Oral BH-qamhs   Followed by  . chlordiazePOXIDE  25 mg Oral Daily  . escitalopram  10 mg Oral Daily  . multivitamin with minerals  1 tablet Oral Daily  . nicotine  21 mg Transdermal Q0600  . thiamine  100 mg Oral Daily  . traZODone  100 mg Oral QHS      No results found for this or any previous visit (from the past 48 hour(s)).  ROS:    Constitutional: WDWN AAF NAD   GI: Negative for N,V,D,C   Neuro: positive for dizziness, negative blurred vision, visual changes, headaches   Resp: Negative for wheezing, SOB, cough   Cardio: Negative for CP, diaphoresis, fatigue   MSK: Negative for joint pain, swelling, DROM, or ambulatory difficulties.     Treatment  Summary:   1. Daily contact with  patient to assess and evaluate symptoms and progress in    treatment.  2. Medication management  3. The patient will deny suicidal ideations or homicidal ideations for 48 hours prior to discharge and have a depression and anxiety rating of 3 or less. The patient will also deny any auditory or visual hallucinations or delusional thinking.  4. The patient will deny any symptoms of substance withdrawal at time of discharge. Plan:  1. Continue current medications as written. 2. Will order B12 injection. 3.Pt. Praised for willingness to do step wise recovery program. Rehab, IOP, reality. 4.. Will follow. Rona Ravens. Shota Kohrs [facility] 01/18/2012

## 2012-01-19 NOTE — Progress Notes (Signed)
Psychoeducational Group Note  Date:  01/19/2012 Time:  1100  Group Topic/Focus:  Rediscovering Joy:   The focus of this group is to explore various ways to relieve stress in a positive manner.  Participation Level:  Active  Participation Quality:  Appropriate, Sharing and Supportive  Affect:  Appropriate  Cognitive:  Appropriate  Insight:  Good  Engagement in Group:  Good  Additional Comments:  none  Mccrae Speciale M 01/19/2012, 3:25 PM

## 2012-01-19 NOTE — H&P (Signed)
Pt seen and evaluated upon admission.  Completed Admission Suicide Risk Assessment.  See orders.  Pt agreeable with plan.  Discussed with team.   

## 2012-01-19 NOTE — Care Management (Signed)
Patient has been accepted to [facility] with admission date of 01/20/12 with arrival time of 2 PM. Patient will be instructed that she will need $250 on admission. Writer spoke with patient's sister to let her know information. Joice Lofts RN MS EdS 01/19/2012  3:30 PM

## 2012-01-19 NOTE — Progress Notes (Signed)
[facility] Group Notes:  (Counselor/Nursing/MHT/Case Management/Adjunct)  01/19/2012 3:04 PM  Type of Therapy:  Group Therapy  Participation Level:  Active  Participation Quality:  Attentive, Monopolizing, Redirectable, Sharing and Supportive  Affect:  Flat  Cognitive:  Appropriate and Oriented  Insight:  Limited  Engagement in Group:  Good  Engagement in Therapy:  Limited  Modes of Intervention:  Activity, Clarification, Limit-setting, Socialization and Support  Summary of Progress/Problems:  Jessica (Rennee)  participated in activity in which patients choose photographs to represent what their life would look and feel like were it in balance and another for out of balance. She choose a photo of a person with sticky note reminders placed on their head to represent how overwhelmed she feels most of the time. She also shared a photo of a person sitting at the ocean's edge in a reflective calm pose. "My mind goes all the time and I say yes to so many things there is nothing/no time to focus on me."  Patient was responsive to redirection as she was repetitive in talking about her situation. Repetitive topics include "the career of 10 years ago, the separation of her marriage, managing the home, her dog dying, and wanting to move without knowing how she could possibly make such a move and keep everyone happy." Others in group offered support and suggestion to "just do [facility] first."   Clide Dales 01/19/2012, 3:04 PM

## 2012-01-19 NOTE — [facility] Suicide Risk Assessment (Signed)
Suicide Risk Assessment  Discharge Assessment      Demographic factors:  Caucasian;Low socioeconomic status;Unemployed  Current Mental Status Per Nursing Assessment: At Discharge:  Pt denied any SI/HI/thoughts of self harm or acute psychiatric issues.  Current Mental Status Per Physician: Patient seen and evaluated. Chart reviewed. Patient stated that her mood was "good".  Ambivalent about going to [facility].  Still in denial about her disease. Her affect was mood congruent and euthymic. She denied any current thoughts of self injurious behavior, suicidal ideation or homicidal ideation. There were no auditory or visual hallucinations, paranoia, delusional thought processes, or mania noted.  Thought process was linear and goal directed.  No psychomotor agitation or retardation was noted. Speech was normal rate, tone and volume. Eye contact was good. Judgment and insight are fair.  Patient has been up and engaged on the unit.  No acute safety concerns reported from team.  No sig withdrawal s/s noted at this time with use of standard librium taper.  Discontinued last two doses in light of reported dizziness and hypotension.  Pt stable.   Loss Factors:  Decrease in vocational status;Loss of significant relationship;Decline in physical health;Financial problems / change in socioeconomic status; lost job  Historical Factors: Family history of suicide;Family history of mental illness or substance abuse;Anniversary of important loss; vague thoughts of death in past; no attempts/plans/current SI  Risk Reduction Factors: Sense of responsibility to family;Religious beliefs about death;Living with another person, especially a relative;Positive social support; open to residential tx  Discharge Diagnoses: Alcohol Use Disorder; Depressive Disorder Unspecified    Cognitive Features That Contribute To Risk: limited insight; denial.  Meds:    . cyanocobalamin  1,000 mcg Intramuscular Once  .  escitalopram  10 mg Oral Daily  . nicotine  21 mg Transdermal Q0600  . thiamine  100 mg Oral Daily  . traZODone  100 mg Oral QHS    Suicide Risk:  Pt viewed as a chronic moderate increased risk of harm to self in light of her past hx and risk factors.  No acute safety concerns on the unit.  Pt contracting for safety and stable for discharge tomorrow am to [facility].  Plan Of Care/Follow-up recommendations: Pt seen and evaluated. Chart reviewed.  Pt stable for and requesting discharge in am.  Transitioning to [facility].  Pt contracting for safety and does not currently meet [place] involuntary commitment criteria for continued hospitalization against her will.  Mental health treatment, medication management and continued sobriety will mitigate against the potential increased risk of harm to self and/or others.  Discussed the importance of recovery further with pt, as well as, tools to move forward in a healthy & safe manner.  Pt agreeable with the plan.  Discussed with the team.  Please see orders, follow up appointments per AVS and full discharge summary to be completed by physician extender.  Recommend follow up with AA.  Diet: Regular.  Activity: As tolerated.     Lupe Carney 01/19/2012, 4:36 PM

## 2012-01-19 NOTE — Progress Notes (Signed)
01/19/2012         Time: 1500      Group Topic/Focus: The focus of this group is on enhancing the patient's understanding of leisure, barriers to leisure, and the importance of engaging in positive leisure activities upon discharge for improved total health.  Participation Level: Active  Participation Quality: Appropriate and Attentive  Affect: Appropriate  Cognitive: Oriented  Additional Comments: None.   Adalyna Godbee 01/19/2012 3:38 PM 

## 2012-01-19 NOTE — Progress Notes (Signed)
D: Pt in bed resting with eyes closed. Respirations even and unlabored. Pt appears to be in no signs of distress at this time. A: Q15min checks remains for this pt. R: Pt remains safe at this time.   

## 2012-01-19 NOTE — Progress Notes (Signed)
D:  Patient up and attending groups today.  Tolerating medications well.  Rates her depression and hopelessness at a 3 out of 10 today and denies suicidal ideation.   A:  Encouraged groups.  Medications maintained as per orders.  R:  Pleasant and cooperative with staff and peers.  Interactive in groups.  Tolerating medications well.

## 2012-01-19 NOTE — Progress Notes (Signed)
D: Pt denies SI/HI/AV. Pt is pleasant and cooperative. Pt is in a good mood. Pt is attending group.   A: Pt was offered support and encouragement. Pt was given scheduled medications. Pt was encourage to attend groups. Q 15 minute checks were done for safety.  R:Pt attends interacts well with peers and staff.Pt seems to have good insight. Pt is visible in the milieu. Pt is taking medication. Pt has no complaints Pt receptive to treatment and safety maintained on unit.

## 2012-01-20 MED ORDER — VITAMIN B-12 1000 MCG PO TABS: 1000.0000 ug | ORAL_TABLET | Freq: Every day | ORAL | Status: DC

## 2012-01-20 MED ORDER — TRAZODONE HCL 100 MG PO TABS: 100.0000 mg | ORAL_TABLET | Freq: Every day | ORAL | Status: DC

## 2012-01-20 MED ORDER — ESCITALOPRAM OXALATE 10 MG PO TABS: 10.0000 mg | ORAL_TABLET | Freq: Every day | ORAL | Status: DC

## 2012-01-20 NOTE — Progress Notes (Signed)
See d/c note

## 2012-01-20 NOTE — Tx Team (Addendum)
Interdisciplinary Treatment Plan Update (Adult)  Date:  01/20/2012 Time Reviewed:  10:29 AM  Progress in Treatment: Attending groups: Yes Participating in groups:  Yes Taking medication as prescribed: Yes Tolerating medication:  Yes Family/Significant other contact made:  Sister, Drinda Butts Patient understands diagnosis:  Yes Discussing patient identified problems/goals with staff:  Yes Medical problems stabilized or resolved:  Yes Denies suicidal/homicidal ideation: Yes Issues/concerns per patient self-inventory:  None identified Other: N/A  New problem(s) identified: None Identified    Additional comments: N/A  Estimated length of stay: Discharge Today  Discharge Plan: [facility] today   Review of initial/current patient goals per problem list:    1.  Goal(s): Address substance use  Met:  Yes  As evidenced by: completing detox protocol and refer to [facility]  2.  Goal (s): Reduce depressive symptoms  Met:  Yes  Target date: by dischargeAs evidenced by: Reducing depression from a 10 to a 0 as reported by pt.      Attendees: Patient:  [name] 01/20/2012 10:29 AM   Family:     Physician:  Lupe Carney, DO 01/20/2012 10:29 AM   Nursing:    Case Manager:  Barrie Folk, RN 01/20/2012 10:30 AM   Counselor:  Ronda Fairly, LCSWA 01/20/2012 10:30 AM   Other:    Other:     Other:     Other:      Scribe for Treatment Team:   Barrie Folk RN MS EDS 01/20/2012 10:29 AM

## 2012-01-20 NOTE — Progress Notes (Signed)
[facility] Group Notes:  (Counselor/Nursing/MHT/Case Management/Adjunct)  01/20/2012 10:59 AM  Type of Therapy:  Psychoeducational Skills  Participation Level:  Did Not Attend  Summary of Progress/Problems: Patient did not attend group.   [name] O 01/20/2012, 10:59 AM

## 2012-01-20 NOTE — Progress Notes (Signed)
[facility] Group Notes:  (Counselor/Nursing/MHT/Case Management/Adjunct)  01/20/2012 11:56 AM  Type of Therapy:  Psychoeducational Skills  Participation Level:  Did Not Attend   Summary of Progress/Problems:Patient did not attend group.   Ardelle Park O 01/20/2012, 11:56 AM

## 2012-01-20 NOTE — Progress Notes (Signed)
Nursing discharge note:  Patient bright  And appropriate d/c. All d/c instruction reviewed and patient verbalized understanding.of instruction.  Rx to be signed by PA and left at front desk for family. Denies SI. Belongings returned and patient escorted to lobby to care of family.

## 2012-01-20 NOTE — Progress Notes (Signed)
[facility] Case Management Discharge Plan:  Will you be returning to the same living situation after discharge: No. At discharge, do you have transportation home?:Yes,   Do you have the ability to pay for your medications:Yes,    Interagency Information:     Release of information consent forms completed and in the chart;  Patient's signature needed at discharge.  Patient to Follow up at:  Follow-up Information    Follow up with [facility] on 01/20/2012. (2 PM)    Contact information:   [address] [place] [phone]         Patient denies SI/HI:   Yes,      Safety Planning and Suicide Prevention discussed:  Yes,    Barrier to discharge identified:No. E Summary and Recommendations: Eager for discharge to [facility]. Has support of friends and family.   [name] 01/20/2012, 11:01 AM

## 2012-01-23 NOTE — Progress Notes (Signed)
Patient Discharge Instructions:  After Visit Summary (AVS):   Faxed to:  01/23/2012 Psychiatric Admission Assessment Note:   Faxed to:  01/23/2012 Suicide Risk Assessment - Discharge Assessment:   Faxed to:  01/23/2012 Faxed/Sent to the Next Level Care provider:  01/23/2012  Faxed to [facility] @ [phone]  Heloise Purpura, Eduard Clos, 01/23/2012, 3:28 PM

## 2012-02-10 ENCOUNTER — Encounter (HOSPITAL_COMMUNITY): Payer: Self-pay | Admitting: *Deleted

## 2012-02-10 ENCOUNTER — Other Ambulatory Visit (HOSPITAL_COMMUNITY): Payer: BC Managed Care – HMO | Attending: Psychiatry | Admitting: Psychology

## 2012-02-10 ENCOUNTER — Ambulatory Visit (HOSPITAL_COMMUNITY)
Admission: RE | Admit: 2012-02-10 | Discharge: 2012-02-10 | Disposition: A | Discharge status: Home / Self Care | Payer: BC Managed Care – HMO | Attending: Psychiatry | Admitting: Psychiatry

## 2012-02-10 DIAGNOSIS — F1020 Alcohol dependence, uncomplicated: Secondary | ICD-10-CM | POA: Insufficient documentation

## 2012-02-10 DIAGNOSIS — F411 Generalized anxiety disorder: Secondary | ICD-10-CM | POA: Insufficient documentation

## 2012-02-10 DIAGNOSIS — F39 Unspecified mood [affective] disorder: Secondary | ICD-10-CM | POA: Insufficient documentation

## 2012-02-10 DIAGNOSIS — J329 Chronic sinusitis, unspecified: Secondary | ICD-10-CM

## 2012-02-10 DIAGNOSIS — Z780 Asymptomatic menopausal state: Secondary | ICD-10-CM

## 2012-02-10 HISTORY — DX: Chronic sinusitis, unspecified: J32.9

## 2012-02-10 HISTORY — DX: Asymptomatic menopausal state: Z78.0
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
## 2012-02-12 NOTE — Discharge Summary (Signed)
Physician Discharge Summary Note  Patient:  [name] is an 51 y.o., female MRN:  [id number] DOB:  November 23, 1960 Patient phone:  [phone] (home)  Patient address:   [address] [place] [address]  Date of Admission:  01/16/2012  Reason for Admission: see H&P.  Discharge Diagnoses: Alcohol Use Disorder; Depressive Disorder Unspecified   Level of Care:  [facility]  Hospital Course:  Pt admitted for crisis stabilization, detox and treatment. Pt attended all therapeutic groups, was active in her treatment planning process and agreed to her current medication regimen for further stability during recovery.  There were no acute issues during treatment and she was open to further residential substance abuse Tx.  All labs were reviewed with her in great detail and medical needs were addressed.  No acute safety issues were noted on the unit. Medications were reviewed with pt and medication education was provided. Mental health treatment, medication management and continued sobriety will mitigate against any increased risk of harm to self and/or others.  Discussed the importance of recovery with pt, as well as, tools to move forward in a healthy & safe manner using the 12 Step Process.    Consults:  None.  Significant Diagnostic Studies: see labs.  Discharge Vitals:   Blood pressure 115/80, pulse 106, temperature 97.9 F (36.6 C), temperature source Oral, resp. rate 16, height 5\' 4"  (1.626 m), weight 69.854 kg (154 lb), last menstrual period 01/15/2010.  Mental Status Exam: See Mental Status Examination and Suicide Risk Assessment completed by Attending Physician prior to discharge.  Discharge destination:  [facility]  Is patient on multiple antipsychotic therapies at discharge:  No   Has Patient had three or more failed trials of antipsychotic monotherapy by history:  No  Recommended Plan for Multiple Antipsychotic Therapies: NA.  Discharge Orders    Future Orders Please Complete By  Expires   Diet - low sodium heart healthy      Increase activity slowly      Discharge instructions      Comments:   Take all of your medications as prescribed.  Be sure to keep ALL follow up appointments as scheduled. This is to ensure getting your refills on time to avoid any interruption in your medication.  If you find that you can not keep your appointment, call the clinic and reschedule. Be sure to tell the nurse if you will need a refill before your appointment.     Medication List  As of 02/12/2012 11:25 PM   STOP taking these medications         lisdexamfetamine 40 MG capsule         TAKE these medications      Indication    escitalopram 10 MG tablet   Commonly known as: LEXAPRO   Take 1 tablet (10 mg total) by mouth daily. For anxiety and depression.    Indication: Depression      traZODone 100 MG tablet   Commonly known as: DESYREL   Take 1 tablet (100 mg total) by mouth at bedtime. For insomnia.    Indication: Trouble Sleeping      vitamin B-12 1000 MCG tablet   Commonly known as: CYANOCOBALAMIN   Take 1 tablet (1,000 mcg total) by mouth daily. For pernicious anemia.            Follow-up Information    Follow up with [facility] on 01/20/2012. (2 PM)    Contact information:   [address] [place] [phone]  Plan Of Care/Follow-up recommendations: Pt seen and evaluated. Chart reviewed. Pt stable for and requesting discharge. Transitioning to [facility]. Pt contracting for safety and does not currently meet [place] involuntary commitment criteria for continued hospitalization against her will. Mental health treatment, medication management and continued sobriety will mitigate against the potential increased risk of harm to self and/or others. Discussed the importance of recovery further with pt, as well as, tools to move forward in a healthy & safe manner. Pt agreeable with the plan. Discussed with the team. Recommend follow up with AA. Diet: Regular.  Activity: As tolerated.    Signed: Lupe Carney 02/12/2012, 11:25 PM

## 2012-02-12 NOTE — Progress Notes (Signed)
    Daily Group Progress Note  Program: CD-IOP   Group Time: 1-2:30 pm  Participation Level: Active  Behavioral Response: Appropriate  Type of Therapy: Psycho-education Group  Topic: The 12-Step community and its importance in early recovery: first half of group was spent in a session with information being provided about the origin of Alcoholics Anonymous and the various aspects of the 12-step recovery movement. A brief scene from "My Name is Francisco Capuchin" was shown and the group saw the first time Dr. Nadine Counts and Francisco Capuchin met. Handouts were provided detailing the different types of meetings as well as specifics about sponsorship and how one might go about securing a sponsor. Group members provided their own feedback and encouraged those members who have not attended any meetings to please go. There was good discussion and disclosure about meetings and how they were helpful in providing ongoing support for sobriety.   Group Time: 2:45- 4 pm  Participation Level: Active  Behavioral Response: Sharing and Assertive  Type of Therapy: Process Group  Topic: Group Process and Introductions: second half of group was spent in process. There were 2 new group members and they shared about their own addiction, what led them to treatment, and what they hope to get out of this program. There was good feedback and the commonalities among members did not go unnoticed. All of the members present agreed that they would attend a 12-step meeting between today and the next group session on Monday.   Summary: The patient was new to the group. She reported she had attended AA meetings while a patient at [facility] and had a good understanding of their importance in her sobriety. She reported she will look for a sponsor, but reminded the group that she just got out of treatment on Tuesday evening. The patient provided more details of her life during process. She explained the insanity that had led to her detox and  subsequent residential treatment. She discussed the things that must change and was able to identify numerous triggers, including the upcoming football season. She was very open and honest about her relationship issues and a phone call she still has to make. The group was extremely supportive and welcoming of her and she responded well to this first group session.  Family Program: Family present? No   Name of family member(s):   UDS collected: No Results:   AA/NA attended?: YesWednesday and Thursday  Sponsor?: No   Pistol Kessenich, LCAS

## 2012-02-13 ENCOUNTER — Other Ambulatory Visit (HOSPITAL_COMMUNITY): Payer: BC Managed Care – HMO | Admitting: Psychology

## 2012-02-14 ENCOUNTER — Encounter (HOSPITAL_COMMUNITY): Payer: Self-pay | Admitting: Psychology

## 2012-02-14 NOTE — Progress Notes (Signed)
Patient ID: [name], female   DOB: July 19, 1960, 51 y.o.   MRN: [id number] Treatment Planning Session: I met with the patient this morning to discuss her treatment and the goals she has for the program. She reported she was doing pretty well and had attended meetings over the weekend. She admitted she is still somewhat confused about why she is feeling so flat? I encouraged her to watch a funny movie and also a sad one and reminded her that we believe she is experiencing PAWS. The patient reported she is still somewhat overwhelmed by the past due bills she has after having ignored things for months. She noted they are in the living room and dining room of her sister's home in [facility] and she intends to get to them soon. I explained the need for goals and she was very agreeable to the primary goal of sobriety and the second one to build recovery support. She stated she will attend an AA meeting every day. In addition, she discussed her depression and the hope that she will regain some of her old enthusiasm and zest for life. I informed her that she would be meeting the Medical Director on Friday and should discuss her current medications. She is prescribed Celexa and perhaps he could add something to that or possibly increase the dose. The patient noted that she is also prescribed Angeliq, but had not gotten it at [facility] and she wants to get a refill because it is very important for her. It addresses some problems she has in pre-menopause.  I encouraged her to discuss her depressive symptoms with AW as well as the need for his other medication. I asked her to identify other actions she might take to address her depression? The patient reported she intended to join [employer] and that she was going to begin walking in the mornings. She has a good friend from high school who lives in [place] and she will be joining her for yoga tomorrow.She also reported she feels a strong spiritual part of  her self returning with AA meetings and that she is praying more and doing her morning meditations and readings.  I encouraged the patient to give her self some time to think about things and not jump back into some sort of work. I suggested she consider allowing herself to take a sabbatical for the next month? She agreed she would consider this suggestion. At 51 yo, the patient has been working since she was 51 yo and could use a little time to reflect and "just be". The treatment plan was reviewed and signed and the session completed accordingly. The patient agreed to meet with me every Tuesday at 11 am for the next few weeks. She remains alcohol-free with a sobriety date of 7/28. Tomorrow she will have 30 days and she will announce this accomplishment in group. Will continue to follow closely.

## 2012-02-14 NOTE — Progress Notes (Signed)
    Daily Group Progress Note  Program: CD-IOP   Group Time: 1-2:30 pm  Participation Level: Active  Behavioral Response: Appropriate  Type of Therapy: Psycho-education Group  Topic: Loss and Grief: A guest facilitator was present for group today and introduced herself and provided a little bit of her history. Following the check-in, a psycho-educational piece was provided focusing on the role that grief and loss can play in recovery. Members discussed how their pain and grief has led to drinking and drugging. A discussion ensued about ways members might address their grief or, if necessary, distract one's self away from the pain for a while. Everyone in the group seemed to be dealing with some sort of loss and the topic was very pertinent to recovery.   Group Time: 2:45- 4pm  Participation Level: Active  Behavioral Response: Sharing  Type of Therapy: Process Group  Topic:Process: second half of group was spent in process. Members discussed current issues in their lives and stressors that are proving difficult to deal with. There were 2 new group members and they introduced themselves briefly. There was good disclosure and feedback among the group. Per their reports, all group members remained alcohol and drug-free over the weekend.    Summary: - Patient reported maintaining sobriety over the weekend. Group focused on identifying significant losses, clarifying the grief process, and how unresolved grief contributes to addiction and relapse.  Patient shared that she has loss her career which was major source of identity. Patient shared that feeling range of emotions is a challenge and worries that she will not regain the ability to feel happiness or sadness. Her fellow group members continued to remind her that her feelings, or lack of them, is part of PAWS and will subside as time passes. She made some good comments.  Family Program: Family present? No   Name of family member(s):    UDS collected: No Results:  AA/NA attended?: YesFriday, Saturday and Sunday  Sponsor?: No   Dorismar Chay, LCAS

## 2012-02-15 ENCOUNTER — Other Ambulatory Visit (HOSPITAL_COMMUNITY): Payer: BC Managed Care – HMO | Admitting: Psychology

## 2012-02-15 DIAGNOSIS — F1920 Other psychoactive substance dependence, uncomplicated: Secondary | ICD-10-CM

## 2012-02-16 LAB — PRESCRIPTION ABUSE MONITORING 17P, URINE
Amphetamine/Meth: NEGATIVE ng/mL
Barbiturate Screen, Urine: NEGATIVE ng/mL
Carisoprodol, Urine: NEGATIVE ng/mL
Cocaine Metabolites: NEGATIVE ng/mL
Fentanyl, Ur: NEGATIVE ng/mL
Oxycodone Screen, Ur: NEGATIVE ng/mL
Propoxyphene: NEGATIVE ng/mL

## 2012-02-16 NOTE — Progress Notes (Signed)
    Daily Group Progress Note  Program: CD-IOP   Group Time: 1-2:30 pm  Participation Level: Active  Behavioral Response: Appropriate  Type of Therapy: Activity Group  Topic: Stress, recognizing it in your body, and learning to relax. First part of group was spent in a guided progressive relaxation session. The lights were dimmed and members sat comfortably as I read a progressive relaxation exercise. After the exercise concluded, members were asked to share their experiences of the guided exercise. It was emphasized that for the group members, there will always be some sort of stressors in their lives and, in early recovery, they must be particularly aware of these stressful times. Their external circumstances may not change, but it is our intention that their responses and reactions to those instances will be dramatically different in early recovery. During the check-in, the group applauded 2 members who have recently achieved 30 days of sobriety.  Group Time: 2:45- 4pm  Participation Level: Active  Behavioral Response: Appropriate and Sharing  Type of Therapy: Process Group  Topic: Group Process; members shared about their struggles and issues in early recovery. There was good feedback about procrastination and job seeking. One member talked about her 'shame' and noted this topic had been discussed in the previous session and had proved helpful. One member apologized as the group session neared the end. He apologized for not having been very engaged and admitted he had been extremely depressed for the past 3-4 days. The member stated he had an appointment to meet with his psychiatrist tomorrow. I thanked him for this disclosure as he has shared very little of himself in sessions and been instructed repeatedly to open up to the group. 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 Two other members noted they have the  same problem. The patient reported her retainer, to wear at night while sleeping, remains in [place] and then she wondered whether that is why she is having the problem with headaches. I encouraged her to have her roommate mail the retainer asap. The patient agreed that these exercises are relaxing for her. In process she reported she is feeling a lot of stress about the unpaid bills she has laying around the house. She admitted she also procrastinates, which only makes the stress worse. The patient received good support and feedback from her fellow group members. She shared that she was observing 30 days of sobriety today and a loud round of applause was offered. She is making good progress in her recovery.    Family Program: Family present? No   Name of family member(s):   UDS collected: Yes Results: negative  AA/NA attended?: YesMonday, Tuesday, Wednesday, Thursday, Friday, Saturday and Sunday  Sponsor?: No   Camree Wigington, LCAS

## 2012-02-17 ENCOUNTER — Other Ambulatory Visit (HOSPITAL_COMMUNITY): Payer: BC Managed Care – HMO | Admitting: Psychology

## 2012-02-22 ENCOUNTER — Other Ambulatory Visit (HOSPITAL_COMMUNITY): Payer: BC Managed Care – HMO | Attending: Psychiatry | Admitting: Psychology

## 2012-02-22 DIAGNOSIS — F1920 Other psychoactive substance dependence, uncomplicated: Secondary | ICD-10-CM

## 2012-02-22 DIAGNOSIS — F3289 Other specified depressive episodes: Secondary | ICD-10-CM | POA: Insufficient documentation

## 2012-02-22 DIAGNOSIS — F1020 Alcohol dependence, uncomplicated: Secondary | ICD-10-CM | POA: Insufficient documentation

## 2012-02-22 DIAGNOSIS — F329 Major depressive disorder, single episode, unspecified: Secondary | ICD-10-CM | POA: Insufficient documentation

## 2012-02-22 NOTE — Progress Notes (Signed)
    Daily Group Progress Note  Program: CD-IOP   Group Time: 1-2:30 pm  Participation Level: Active  Behavioral Response: Appropriate  Type of Therapy: Psycho-education Group  Topic: Forgiveness: A presentation was provided on forgiveness and what that includes. The group discussed the need to identify boundaries, acceptance, acknowledgement of the pain associated with the wrong. Included was the recognition of how our barriers to forgiveness of self and others can contribute to relapse. There was good sharing and disclosures among the group. Prior to the break, the therapist led the group in a progressive relaxation exercise.   Group Time: 2:45- 4pm  Participation Level: Active  Behavioral Response: Sharing  Type of Therapy: Process Group  Topic: Process: second half of group spent in process. Members discussed issues and concerns around recovery and everyone maintained that they had remained abstinent. Included in this half of group was a discussion on plans for the upcoming holiday and how members intended to remain sober. While some had very definite plans around 12-steps and sponsors, other members' plans were very uncertain and they were encouraged to make a plan before the weekend arrived.   Summary: - Pt. reported maintaining sobriety since Wednesday's group. Participated in progressive muscle relaxation exercise. Group focused on the role of forgiveness of self and others in recovery. Pt. shared challenge of forgiving ex-husband and learning to accept his behavior. Pt. shared that she continues to feel flat and is concerned about absence of emotions.  Family Program: Family present? No   Name of family member(s):   UDS collected: No Results:   AA/NA attended?: YesMonday, Tuesday, Wednesday, Thursday, Friday, Saturday and Sunday  Sponsor?: No   Mozell Hardacre, LCAS

## 2012-02-23 LAB — PRESCRIPTION ABUSE MONITORING 17P, URINE
Amphetamine/Meth: NEGATIVE ng/mL
Barbiturate Screen, Urine: NEGATIVE ng/mL
Cannabinoid Scrn, Ur: NEGATIVE ng/mL
Carisoprodol, Urine: NEGATIVE ng/mL
Cocaine Metabolites: NEGATIVE ng/mL
Fentanyl, Ur: NEGATIVE ng/mL
Tapentadol, urine: NEGATIVE ng/mL
Tramadol Scrn, Ur: NEGATIVE ng/mL

## 2012-02-23 NOTE — Progress Notes (Signed)
    Daily Group Progress Note  Program: CD-IOP   Group Time: 1-2:30 pm  Participation Level: Active  Behavioral Response: Appropriate  Type of Therapy: Process Group  Topic:Group Process: first half of group was spent in check-in and process. Members shared about the past holiday weekend and 2 members admitted they had used since the last session. After their slips were discussed and reviewed, members talked about their struggles and issues in early recovery. Two new group members were present and they also checked-in. One member was asked to share about his alcohol/drug use and he was very open about his addiction and other health problems. Random drug tests were collected and the session included good disclosure and feedback within the group.   Group Time: 2:45- 4pm  Participation Level: Active  Behavioral Response: Sharing  Type of Therapy: Psycho-education Group  Topic: "Communication: Assertive Communication and what that looks like". Second half of this group session was spent in a psycho educational session focused on assertive communication. The session included a handout and role-playing among group members. The importance of being assertive and getting one's needs met was discussed with members sharing about their own cooperation in assuming a diminished role and acceptable treatment among loved ones. There was good feedback among the group.  Summary: The patient reported she had had a good holiday weekend. She noted she has continued to attend meetings and is seeking a tentative sponsor. In process, she admitted that when she looks back she can't believe the poor decisions she made and the way she allowed others to use her. She especially identified the "boyfriend" she allowed to move into the house and how she took him to work, made his lunch every day, and didn't even require him to pay rent. The patient shook her head and agreed that she will need to work on becoming more  assertive and not playing the passive role. She continues to make excellent progress in her recovery and is doing everything we have requested of her.   Family Program: Family present? No   Name of family member(s):  UDS collected: Yes Results: negative  AA/NA attended?: YesMonday, Tuesday, Wednesday, Thursday, Friday, Saturday and Sunday  Sponsor?: No   Farrah Skoda, LCAS

## 2012-02-24 ENCOUNTER — Other Ambulatory Visit (HOSPITAL_COMMUNITY): Payer: BC Managed Care – HMO | Admitting: Psychology

## 2012-02-27 ENCOUNTER — Other Ambulatory Visit (HOSPITAL_COMMUNITY): Payer: BC Managed Care – HMO

## 2012-02-27 NOTE — Progress Notes (Signed)
    Daily Group Progress Note  Program: CD-IOP   Group Time: 1-2:30 pm  Participation Level: Active  Behavioral Response: Appropriate  Type of Therapy: Process Group  Topic: Group Process: first part of group was spent in process. There was a new group member present and during this half of group, she shared about her drinking and desires to quit. Members shared their frustrations in early recovery with family, friends and circumstances. The group was reminded that they are responsible for the choices they make. One member was present for his second session and he shared a little about his opiate use and anger about being here. He admitted his parents are making him come and he is also angry because he is not allowed to take his Adderall while in this program. His fellow group members shared their experiences, specifically about how their disease progressed and that they had recognized they couldn't do it themselves. There was good feedback and disclosure among members.  Group Time: 2:45- 4pm  Participation Level: Active  Behavioral Response: Sharing  Type of Therapy: Activity Group  Topic: Activity: in second part of group members were asked to identify 2 people that they admire and to identify 2-3 qualities that they possess for which they are admired. After being provided with a few minutes to consider this assignment, members were asked to share about their answers. Every member shared that they admired a family member or friend. They also identified the characteristics or traits that they admired about them. As the activity progress, I pointed out that these are traits and qualities that each group member values. This exercise invites participants to recognize traits and qualities that they admire and value. This activity aids members in identifying the values they consider important and is used to stimulate reflection and direction towards living out their values.   Summary: The  patient reported she is doing well. She shared about her growing realizations of how many mindless things she did while drinking. She reported she has a lot of work to Psychologist, forensic the financial responsibilities that she ignored while in her addiction. The patient identified her mother and Harriette Bouillon - a football player on the [employer] football team as people she admires. Their common characteristics included hard work and motivated. Inoted these were very similar to herself and her strong work Associate Professor. The patient has made excellent progress in her recovery and remains sober with a date of July 28th.    Family Program: Family present? No   Name of family member(s):   UDS collected: No Results:  AA/NA attended?: YesMonday, Tuesday, Wednesday, Thursday, Friday, Saturday and Sunday  Sponsor?: Yes   Johnetta Sloniker, LCAS

## 2012-02-28 ENCOUNTER — Encounter (HOSPITAL_COMMUNITY): Payer: Self-pay | Admitting: Psychology

## 2012-02-28 NOTE — Progress Notes (Incomplete)
Patient ID: [name], female   DOB: 30-May-1961, 51 y.o.   MRN: [id number] Individual Therapy Session: Met with the patient as scheduled this morning for her weekly individual therapy session. She reported she had walked this morning and finds her morning exercise to be very relaxing and a nice time for herself.

## 2012-02-29 ENCOUNTER — Other Ambulatory Visit (HOSPITAL_COMMUNITY): Payer: BC Managed Care – HMO | Admitting: Psychology

## 2012-02-29 DIAGNOSIS — F1920 Other psychoactive substance dependence, uncomplicated: Secondary | ICD-10-CM

## 2012-03-01 NOTE — Progress Notes (Signed)
    Daily Group Progress Note  Program: CD-IOP   Group Time: 1-2:30 pm  Participation Level: Active  Behavioral Response: Appropriate  Type of Therapy: Psycho-education Group  Topic: Self-Esteem: How Negative Core Beliefs Develop and are Maintained. A presentation was provided examining the development of one's self-esteem and how negative core beliefs can develop. Handouts were provided to group members. Self-esteem is formed in childhood and is influenced by many different experiences one has in her/his world. The formation of negative core beliefs and subsequent development of rules and behaviors to protect one from the activation of those beliefs was charted on the board. Members shared about their early experiences and the beliefs about themselves that developed as results of those experiences. The patterns of behaviors that many group members have followed based on those core beliefs were highlighted and made perfect sense to those reporting them. The session proved enlightening to many. Friday's group session will focus on how one can begin to change those core beliefs.  Group Time: 2:45- 4pm  Participation Level: Active  Behavioral Response: Sharing and Assertive  Type of Therapy: Process Group  Topic: Group Process: second half of group was spent in process. Two members admitted they had relapsed with one member explaining she had lied about it all week and had felt very guilty keeping the truth from the group. The other member seemed very discouraged and questioned whether she would ever stop using. Her disclosures included childhood experiences that served to form her negative core beliefs about self-worth and value. There was good disclosure among group members with very affirming feedback to those struggling.   Summary: The patient reported she had felt very badly about herself after she lost her job 4 years ago. That is when her drinking first increased and then when her  husband walked out almost 3 years ago, her sense of self completely plummeted. She offered good feedback to another female and how this young woman is very co-dependent. 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 The patient reported she is feeling better and realizes she has her whole life ahead of her and things are starting over for her. She made some good comments.   Family Program: Family present? No   Name of family member(s):   UDS collected: Yes Results: negative  AA/NA attended?: YesMonday, Tuesday, Wednesday, Thursday, Friday, Saturday and Sunday  Sponsor?: Yes   Klark Vanderhoef, LCAS

## 2012-03-02 ENCOUNTER — Other Ambulatory Visit (HOSPITAL_COMMUNITY): Payer: BC Managed Care – HMO | Admitting: Psychology

## 2012-03-04 NOTE — Progress Notes (Signed)
    Daily Group Progress Note  Program: CD-IOP   Group Time: 1-2:30 pm  Participation Level: Active  Behavioral Response: Appropriate  Type of Therapy: Psycho-education Group  Topic: Positive Qualities Record: Beginning to Challenge the Negative Core Belief: first half of group spent in presentation about challenging one's negative core beliefs. The first step requires one identifying their "positive qualities". Group members were provided with a handout that included 12 different questions which required members to identify positive aspects of themselves. Members confirmed that they understood the development of negative core beliefs, but were eager to learn how to begin to change that. There was good disclosure and conversation among the group and all were able to identify positive qualities and characteristics that they possess.  Group Time: 2:45- 4pm  Participation Level: Active  Behavioral Response: Sharing  Type of Therapy: Process Group  Topic: Group Process/Graduation: the second half of group was spent in process. Members talked about their current frustrations and difficulties in early recovery. There was good feedback and sharing about how they have dealt with these commonly held experiences. As the session came to an end, a graduation ceremony was held for a successfully graduating member. There were brownies and kind words about the graduating member. The session proved very effective for all present.  Summary: The patient reported she is really feeling good about herself. I noted she has been on somewhat of a roller coaster this past week since learning last Friday that he husband had filed for divorce. He had spoken with her on Thursday and didn't mention it. She had felt angry, sad, revengeful, and a plethora of other emotions over the past week. She laughed and noted this group and her AA home group had really helped her see more clearly. 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 She reported she is overwhelmed thinking about what to do with the house in [place] where she lives and what she and her soon-to-be ex-husband will handle the finances. She shared some kind words with the graduating member and emphasized the importance of maintaining his focus on sobriety.    Family Program: Family present? No   Name of family member(s):   UDS collected: No Results:   AA/NA attended?: YesMonday, Tuesday, Wednesday, Thursday, Friday, Saturday and Sunday  Sponsor?: Yes   Japheth Diekman, LCAS

## 2012-03-05 ENCOUNTER — Other Ambulatory Visit (HOSPITAL_COMMUNITY): Payer: BC Managed Care – HMO | Admitting: Psychology

## 2012-03-06 NOTE — Progress Notes (Signed)
    Daily Group Progress Note  Program: CD-IOP   Group Time: 1-2:30 pm  Participation Level: Active  Behavioral Response: Sharing  Type of Therapy: Process Group  Topic: Trigger, Thought, Cravings, and Use: the relapse process and how to disrupt it. Second half of group was spent reviewing the relapse process and how one must interrupt this path towards relapse. One member had relapses last weekend and the process of her relapse mapped on the board. Members identified where she could have stopped this process and remains sober instead of smoking crack. The importance of "telling on yourself" was emphasized with members giving their own examples from the past. There was good disclosure among the group and positive feedback.  Group Time: 2:45- 4pm Participation Level: Active  Behavioral Response: Appropriate  Type of Therapy: Psycho-education Group  Topic:Trigger, Thought, Cravings, and Use: the relapse process and how to disrupt it. Second half of group was spent reviewing the relapse process and how one must interrupt this path towards relapse. One member had relapses last weekend and the process of her relapse mapped on the board. Members identified where she could have stopped this process and remains sober instead of smoking crack. The importance of "telling on yourself" was emphasized with members giving their own examples from the past. There was good disclosure among the group and positive feedback.   Summary: The patient reported she had had a great weekend and remains sober with a sobriety date of July 28th. She reported a good weekend spent attending AA meetings (she has committed to 43 in 54) and visiting with friends and relatives who had travelled to [place]. The patient reported she had decided to ask a good friend who is in recovery to be her temporary sponsor. She was pleased to have made this decision and explained that she was going to be going back and forth from her to  her home in [place] and this would allow her more continuity and stability than having a sponsor exclusively here in the [place] area. The patient reported she is feeling better about herself and not so overwhelmed or devastated that her husband has finally filed for divorce. The patient was actively engaged in the discussion on the relapse process and agreed there were certain things that she would not even risk doing because they could herald or invite drinking. The patient continues to make excellent progress in her recovery.   Family Program: Family present? No   Name of family member(s):   UDS collected: No Results:  AA/NA attended?: YesMonday, Tuesday, Wednesday, Thursday, Friday, Saturday and Sunday  Sponsor?: Yes   Rajiv Parlato, LCAS

## 2012-03-07 ENCOUNTER — Other Ambulatory Visit (HOSPITAL_COMMUNITY): Payer: BC Managed Care – HMO | Admitting: Psychology

## 2012-03-07 DIAGNOSIS — F1920 Other psychoactive substance dependence, uncomplicated: Secondary | ICD-10-CM

## 2012-03-08 LAB — PRESCRIPTION ABUSE MONITORING 17P, URINE
Amphetamine/Meth: NEGATIVE ng/mL
Benzodiazepine Screen, Urine: NEGATIVE ng/mL
Buprenorphine, Urine: NEGATIVE ng/mL
Cannabinoid Scrn, Ur: NEGATIVE ng/mL
Carisoprodol, Urine: NEGATIVE ng/mL
Creatinine, Urine: 37.47 mg/dL (ref >20.0)
Opiate Screen, Urine: NEGATIVE ng/mL
Propoxyphene: NEGATIVE ng/mL
Tapentadol, urine: NEGATIVE ng/mL

## 2012-03-08 NOTE — Progress Notes (Signed)
    Daily Group Progress Note  Program: CD-IOP   Group Time: 1-2:30 pm  Participation Level: Active  Behavioral Response: Appropriate  Type of Therapy: Psycho-education Group  Topic: Group Process: members continued to discuss the information gained from the film shown earlier in the session. Members talked about their experiences when they first began using alcohol and drugs and many of these descriptions matched the information provided in the film. I emphasized that they were not responsible for having an addiction, but that they are responsible for learning what they need to know to take care of their disease and what they must do to keep it in "remission".   Group Time: 2:45- 4pm  Participation Level: Active  Behavioral Response: Sharing and Assertive  Type of Therapy: Process Group  Topic:Group Process: members continued to discuss the information gained from the film shown earlier in the session. Members talked about their experiences when they first began using alcohol and drugs and many of these descriptions matched the information provided in the film. I emphasized that they were not responsible for having an addiction, but that they are responsible for learning what they need to know to take care of their disease and what they must do to keep it in "remission".    Summary: the patient was attentive and nodded her head many times during the film. [name] reported that when she first used her primary drug of addiction she felt very whole or complete. She recognized the importance of educating about addiction and the genetic element and reported she would like to get this film for her 2 grown sons to watch. She reported all one can do is educate and hope they make the right decisions. The patient reported she is doing well in her recovery, but admitted she is riding the roller coaster relative to her pending divorce from her husband after being separated for over 2 years. The  emotional upheaval was normalized by the group and the patient responded well to the feedback and support she received. She continues to make excellent progress in her recovery and her sobriety date remains July 28th.    Family Program: Family present? No   Name of family member(s):   UDS collected: Yes Results: negative  AA/NA attended?: YesMonday, Tuesday, Wednesday, Thursday, Friday, Saturday and Sunday  Sponsor?: Yes   Nahima Ales, LCAS

## 2012-03-09 ENCOUNTER — Other Ambulatory Visit (HOSPITAL_COMMUNITY): Payer: BC Managed Care – HMO

## 2012-03-12 ENCOUNTER — Other Ambulatory Visit (HOSPITAL_COMMUNITY): Payer: BC Managed Care – HMO | Admitting: Psychology

## 2012-03-12 DIAGNOSIS — F1920 Other psychoactive substance dependence, uncomplicated: Secondary | ICD-10-CM

## 2012-03-13 ENCOUNTER — Encounter (HOSPITAL_COMMUNITY): Payer: Self-pay | Admitting: Psychology

## 2012-03-13 LAB — PRESCRIPTION ABUSE MONITORING 17P, URINE
Amphetamine/Meth: NEGATIVE ng/mL
Barbiturate Screen, Urine: NEGATIVE ng/mL
Benzodiazepine Screen, Urine: NEGATIVE ng/mL
Buprenorphine, Urine: NEGATIVE ng/mL
Cannabinoid Scrn, Ur: NEGATIVE ng/mL
Carisoprodol, Urine: NEGATIVE ng/mL
Fentanyl, Ur: NEGATIVE ng/mL
Meperidine, Ur: NEGATIVE ng/mL
Methadone Screen, Urine: NEGATIVE ng/mL
Propoxyphene: NEGATIVE ng/mL
Tapentadol, urine: NEGATIVE ng/mL

## 2012-03-13 LAB — ALCOHOL METABOLITE (ETG), URINE: Ethyl Glucuronide (EtG): NEGATIVE ng/mL

## 2012-03-13 NOTE — Progress Notes (Signed)
    Daily Group Progress Note  Program: CD-IOP   Group Time: 1-2:30 pm  Participation Level: Active  Behavioral Response: Appropriate and Sharing  Type of Therapy: Psycho-education Group  Topic: Chief Operating Officer and the Recovery Pie: first half of group spent checking-in. members shared about their recovery activities over the past weekend. While some members had attended 5 or 6 AA/NA meetings, some had not attended any meetings. It seemed perfect that the group had a guest speaker today. She had graduated successfully from the program and had attained over 8 months of sobriety. She shared about her own recovery program and what she has done to insure sobriety. The patient emphasized the importance of the 12-step community for her sobriety and reported she attends around 2 meetings per day. She calls her sponsor every day and frequently meets with others in recovery for lunch or coffee. The importance of her spiritual practice and faith are also very important on a daily basis. There was good feedback among members and a welcoming response to the guest speaker.   Group Time: 2:45- 4pm  Participation Level: Active  Behavioral Response: Sharing  Type of Therapy: Process Group  Topic: Group Process: the second half of group was spent in process. Members shared about their current difficulties and struggles in early recovery. One member admitted he was depressed and couldn't seem to get the drive to go to meetings. Another member stated she hated to ask her mother to take her to a meeting. One member admitted that she had been lying about attending meetings and working with her sponsor. She rationalized her use by suggesting she had got into bad habits protecting herself from her mother. None of the other group members seemed surprised by this disclosure. The importance of meeting attendance was reiterated and every member stated they would attend a meeting between today and the next group session.    Summary: The patient reported she had attended the [employer] football game on Saturday. It was the first college game she had gone to in probably 30 years or more where she was not drunk. She admitted it was different, but good. She apologized for having missed group on Friday and explained she had driven up early with a friend on Friday evening. She admitted it had felt good to get away and the mountains were beautiful. The patient seemed to enjoy the guest speaker and was able to relate to some of her disclosures, especially about her feelings when she lost her job. In process, the patient reported she is doing well in her recovery and is calling her sponsor as instructed. She noted she is going to contact her attorney and find out what she must do to pursue the divorce as filed recently by her husband. The patient seems to be doing very well and made some excellent comments. Her sobriety date remains July 28th.    Family Program: Family present? No   Name of family member(s):   UDS collected: Yes Results: negative  AA/NA attended?: YesMonday, Tuesday, Wednesday, Thursday, Friday and Sunday  Sponsor?: Yes   Faren Florence, LCAS

## 2012-03-13 NOTE — Progress Notes (Signed)
Patient ID: [name], female   DOB: 1960-12-12, 51 y.o.   MRN: [id number] CD-IOP Treatment Plan Update: I met with the patient this morning for our regularly scheduled Tuesday morning appointment. The patient continues to make good progress in her recovery and remains alcohol-free since July 28th. Today I epxlained the need for an update to her treatment plan and the importance of identifying progress made towards her goals of treatment. Relative to her first goal, which is total abstinence, the patient has made excellent progress. She is actively engaged in the 12-step community, has a home group and a temporary sponsor and is working the steps. She remains committed to 90 meetings in 90 days. This was her second treatment goal and she is doing very well in this respect. The patient's 3rd treatment goal is to address her depression and feel more hopeful about life. When asked to describe her depressive symptoms on a scale of 1-10, she described herself as a '5". She continues to suffer from PAWS and gets frustrated, at times, about her poor short term memory. She is also emotionally labile and experiences a roller coaster of emotions on many days. The patient is struggling with the recent knowledge that her husband has finally filed divorce papers after 24 years of marriage. He actually abandoned his wife and 2 teen-age sons almost 3 years ago, but just filed for divorce less than 10 days ago. The patient has admitted being shocked and devastated with this news, despite admitting she could never trust him again. She has contacted an attorney and will be setting about seeking the terms to their divorce, what will happen to their house, etc. She recognizes that her feelings will be up and down and there is nothing wrong with this. She continues to read the handouts on "Assertiveness and Communication" and admitted she is working everyday to become more assertive. The documentation was reviewed and the  update completed accordingly. She continues to be monitored by Jorje Guild, the medical director, and is recommended to remain in the program for at least another 3-4 weeks.

## 2012-03-14 ENCOUNTER — Other Ambulatory Visit (HOSPITAL_COMMUNITY): Payer: BC Managed Care – HMO | Admitting: Psychology

## 2012-03-16 ENCOUNTER — Other Ambulatory Visit (HOSPITAL_COMMUNITY): Payer: BC Managed Care – HMO | Admitting: Psychology
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
## 2012-03-19 ENCOUNTER — Other Ambulatory Visit (HOSPITAL_COMMUNITY): Payer: BC Managed Care – HMO | Admitting: Psychology

## 2012-03-19 NOTE — Progress Notes (Signed)
    Daily Group Progress Note  Program: CD-IOP   Group Time: 1-2:30 pm  Participation Level: Active  Behavioral Response: Appropriate and Sharing  Type of Therapy: Psycho-education Group  Topic: Wheel of Life: first half of group spent in psycho-educational session. Members were provided with handouts to complete on their Wheel of Life. The wheel consisted of 8 segments reflecting 8 aspects of life that are critical in recovery. Members were asked to complete and draw on the board in order to share with their fellow group members. The session required disclosure and explanation and there was good feedback during the session.  Group Time: 2:45- 4pm  Participation Level: Active  Behavioral Response: Sharing  Type of Therapy: Process Group  Topic: Process/Wheel of Life: second half of group included the few members to complete their 'wheel". Upon completion, members shared about current issues and struggles they are experiencing. There was good sharing among the group. As the session ended, members shared their plans for the weekend. Every one of them stated their intention to attend at least one 12-step meeting.  Summary:The patient reported she had traveled to [place], [place] and visited with her son yesterday. They had a wonderful visit together. She admitted she continues to struggle with PAWS and had to stop three times because she felt so much anxiety in the traffic driving through [place]. The patient identified her career, or lack of it, as well as money as the biggest deficits in her wheel. The patient admitted she had realized while putting this on the board, that she had never been without a relationship or lived by herself. She agreed that it might be good for her to do that. The patient continues to make excellent progress in her insight and acceptance of the needed changes in her life.   Family Program: Family present? No   Name of family member(s):   UDS collected: No  Results:   AA/NA attended?: YesMonday, Tuesday, Wednesday, Thursday, Friday, Saturday and Sunday  Sponsor?: Yes   Maveryk Renstrom, LCAS

## 2012-03-20 ENCOUNTER — Encounter (HOSPITAL_COMMUNITY): Payer: Self-pay | Admitting: Psychology

## 2012-03-20 NOTE — Progress Notes (Signed)
    Daily Group Progress Note  Program: CD-IOP   Group Time: 1-2:30 pm  Participation Level: Active  Behavioral Response: Appropriate and Sharing  Type of Therapy: Psycho-education Group  Topic: The Feeling Chart: first part of group was spent in a presentation on "The Feeling Chart". Upon completing the check-in, and in the company of 3 new group members, a handout was provided identifying the progression of substance use, abuse and, finally, addiction. The chart was focused on the way one learns to change one's feelings through the substance use. While one first begins and finds herself able to change her feelings towards euphoria and pleasure, as the use progresses, there is less pleasure and more pain. Members shared their own experiences as their substance use increased and the growing problems they began to have due to the drugs. There was good disclosure and discussion on the topic for today.  Group Time: 2:45- 4pm  Participation Level: Active  Behavioral Response: Sharing  Type of Therapy: Process Group  Topic: Group Process and Introductions: second half of group was spent in process. Members shared about their weekend and engagement in [facility]. Two of the three new group members introduced themselves and described their history of drug use and what had brought them to the program. The group responded with support and encouragement to the new members and this first session proved effective for them.  Summary: The patient reported she had a good weekend and had remained alcohol-free. She attended [employer] and has spoken with her sponsor. She admitted she had felt mostly agony during the last 2-3 years of her drinking. She hadn't realized how she was just addressing and numbing her feelings all that time. The patient noted she had experienced some cravings when her sister suggested they build a bonfire on Sunday night. Renee explained that when she heard the word  "bonfire", she immediately thought about her 'winter drink', Christiane Ha. For the next 30 minutes she obsessed about the whiskey and really struggled to eradicate it from her mind. She admitted being surprised that she had experienced such powerful cravings just by hearing a word. The patient provided good support and validation to the new group member and she made some excellent comments.    Family Program: Family present? No   Name of family member(s):   UDS collected: No Results:   AA/NA attended?: YesMonday, Tuesday, Wednesday, Thursday, Friday, Saturday and Sunday  Sponsor?: Yes   Kahdijah Errickson, LCAS

## 2012-03-20 NOTE — Progress Notes (Signed)
Patient ID: [name], female   DOB: Nov 06, 1960, 51 y.o.   MRN: [id number] CD-IOP: Individual Therapy Session. Met with the patient this morning for our weekly session. She reported she is doing well. She continues to make excellent progress in her recovery and is very engaged and open in the group sessions. I asked about the craving she had admitted in group yesterday. She had reported her sister had mentioned having a bonfire on Sunday evening and the patient immediately thought of Christiane Ha, "my winter drink" and continued to obsess and even taste this liquor for the next 30 minutes. She admitted she was surprised by the strength and duration of the craving. At the same time, she assured me that she has numerous tools in her tool box and would have phoned her sponsor if she felt at all in danger. She remains alcohol-free with a sobriety date of late July and is very active in the AA community and has recently secured a temporary sponsor. She described herself as a '5' out of 10 on the depression scale. She is no longer flat, but experiencing a variety of emotions with fluctuations. This is to be expected. The patient also reported ongoing issues with PAWS, but is very patient with herself and recognizes them as part of the early recovery process. The patient seems to be returning to her old self, as she pointed out, by her frustration and aggravation with her sister and her 'stuff'. 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 She wondered why she is becoming more aware of these feelings now because they were not prevalent much in the recent past. She noted she is using the same skills that she uses with cravings...distraction. The patient reported she is going to visit her mother this weekend instead of going to the [employer] football game with her sister. We agreed that with sobriety comes the  growing recognition that there is life after football. I suggested that it is a wonderful way to justify drinking all Saturday and Sunday and the patient agreed with this. She assured me she wouldn't have spent so much time watching football if there had been no alcohol included. Our session came to an end, but we agreed we would discuss the ongoing thoughts about sex in our next session. The patient continues to make good progress in her recovery and is an excellent group member and serves as a Occupational hygienist of sorts in the group.

## 2012-03-21 ENCOUNTER — Other Ambulatory Visit (HOSPITAL_COMMUNITY): Payer: BC Managed Care – HMO | Attending: Psychiatry

## 2012-03-21 ENCOUNTER — Other Ambulatory Visit (HOSPITAL_COMMUNITY): Payer: Self-pay | Admitting: Physician Assistant

## 2012-03-21 DIAGNOSIS — F3289 Other specified depressive episodes: Secondary | ICD-10-CM | POA: Insufficient documentation

## 2012-03-21 DIAGNOSIS — F1020 Alcohol dependence, uncomplicated: Secondary | ICD-10-CM | POA: Insufficient documentation

## 2012-03-21 DIAGNOSIS — F329 Major depressive disorder, single episode, unspecified: Secondary | ICD-10-CM | POA: Insufficient documentation

## 2012-03-23 ENCOUNTER — Other Ambulatory Visit (HOSPITAL_COMMUNITY): Payer: BC Managed Care – HMO

## 2012-03-26 ENCOUNTER — Other Ambulatory Visit (HOSPITAL_COMMUNITY): Payer: BC Managed Care – HMO | Admitting: Psychology

## 2012-03-27 ENCOUNTER — Encounter (HOSPITAL_COMMUNITY): Payer: Self-pay | Admitting: Psychology

## 2012-03-27 LAB — PRESCRIPTION ABUSE MONITORING 17P, URINE
Barbiturate Screen, Urine: NEGATIVE ng/mL
Benzodiazepine Screen, Urine: NEGATIVE ng/mL
Cannabinoid Scrn, Ur: NEGATIVE ng/mL
Cocaine Metabolites: NEGATIVE ng/mL
Creatinine, Urine: 95.17 mg/dL (ref >20.0)
Fentanyl, Ur: NEGATIVE ng/mL
MDMA URINE: NEGATIVE ng/mL
Meperidine, Ur: NEGATIVE ng/mL
Methadone Screen, Urine: NEGATIVE ng/mL
Tapentadol, urine: NEGATIVE ng/mL
Zolpidem, Urine: NEGATIVE ng/mL

## 2012-03-27 NOTE — Progress Notes (Signed)
Patient ID: [name], female   DOB: Apr 30, 1961, 51 y.o.   MRN: [id number] CD-IOP : Individual Therapy Session. Met with the patient this morning for her weekly 11 am therapy session. She reported she felt so good today after having been sick all weekend. She had missed 2 group sessions with strep throat, a double ear infection, and acute sinus infection. She noted she was given 2 Z-packs. The patient admitted she had slept most of the weekend and it had felt good and she needed it. She reported she had cooked dinner last night for her sister, Drinda Butts, and Annette's family. I informed her that her UA from the 2nd had been positive for amphetamines. She reported she has been taking Claritin-D, but nothing else. The prescribed medications were given to her on the 4th and long after the drug test had been collected. Despite the positive UA from last Wednesday, the patient reported she is doing well in her #1 treatment goal in recovery and remains alcohol-free with a sobriety date of July 28th. She is making excellent progress in her #2 treatment goal, which includes building support for her sobriety. She has a home group ([facility]) and is attending other meetings and phones her sponsor every other day. She admitted she is fine admitting to someone, "I am an alcoholic". Her 3rd goal addresses her depression and she reported she feels very good about herself and her life. She continues to take the Celexa, Angeliq, and anti-biotics for the sinus infection. She reported feeling a sense of relief and, almost serenity, and after speaking with her attorney last week for almost 90 minutes, he is beginning the documentation and to take steps to start the divorce proceedings with her husband, Tammy Sours. They have been separated for almost 3 years, but he just recently filed and she secured an attorney to handle this case. She reported she enjoyed the group session yesterday about feelings and agreed that she has to  become acquainted with what she is feeling and why. 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 She focused more on other people's feelings and, largely, became distant and unaware of what she was feeling. She drank as the painful feeling from her job loss and subsequent separation occurred over the past 4+ years. We also discussed the importance of being assertive and insuring that she gets her needs met in the immediate future. The patient has been co-dependent for many years and only recently recognized this behavior pattern in her daily life. The session came to an end with the patient continuing to make excellent progress in her recovery. She has a good support system, displays strong determination and motivation to carry on these changes in her life, and a very good understanding of her daily recovery needs. We will seek an explanation from the lab regarding the amphetamine-positive UA. She has approximately 8 sessions remaining.

## 2012-03-28 ENCOUNTER — Other Ambulatory Visit (HOSPITAL_COMMUNITY): Payer: BC Managed Care – HMO | Admitting: Psychology

## 2012-03-28 LAB — AMPHETAMINES (GC/LC/MS), URINE: MDMA GC/MS Conf: NEGATIVE ng/mL

## 2012-03-28 NOTE — Progress Notes (Signed)
    Daily Group Progress Note  Program: CD-IOP   Group Time: 1-2:30 pm  Participation Level: Active  Behavioral Response: Appropriate and Sharing  Type of Therapy: Psycho-education Group  Topic: Feelings: learning to identify them and why they are important. First part of group included check-in and psycho-education piece. Members shared about the past weekend and activities they participated in that supported their recovery.  Upon completion, handouts were provided on the topic of feelings. Members discussed the hand out, "Why do we feel the way we do" followed by "Why Become Aware of Feelings"? Members agreed that it was hard and scary to risk feeling while others shared that they weren't sure what their feelings even were? There was good disclosure among members and a lively discussion ensued.   Group Time: 2:45- 4pm  Participation Level: Active  Behavioral Response: Sharing  Type of Therapy: Process Group  Topic: Group Process: second half of group was spent in process. Members shared about their current issues and concerns. One member had used over the weekend and her relapse was reviewed at length. Members were quick to point out that she had done nothing to resist or fight the urge to drink. Alternatives to drinking were identified and the member agreed that she could have done other things to avoid drinking.   Summary: The patient returned after being gone 2 sessions. She reported she had been sick with strep, sinus infection, and double ear infection. She reported she has struggled with feelings and agreed that taking on other people's concerns at an early age left her unaware and inattentive to her own feelings and experiences. She reported she has only recently recognized that being alone doesn't mean loneliness. She admitted she had never lived by herself and is used to being around lots of people and activity. Today she realizes that being alone can be wonderful and a rich  fulfilling time - it has nothing to do with being lonely. She made some good comments and continues to share her own experiences with other group members out of support. She is making excellent progress in her recovery.   Family Program: Family present? No   Name of family member(s):   UDS collected: Yes Results: positive for amphetamines - but taking Claritin- D  AA/NA attended?: Yes, Monday, Tuesday, Wednesday, Thursday, Friday, Saturday and Sunday  Sponsor?: Yes   Joellyn Grandt, LCAS

## 2012-03-29 NOTE — Progress Notes (Signed)
    Daily Group Progress Note  Program: CD-IOP   Group Time: 1-2:30 pm  Participation Level: Active  Behavioral Response: Appropriate and Sharing  Type of Therapy: Process Group  Topic: Group Process: first part of group was spent in process. Members checked-in and the spent 5 minutes in quiet, just breathing and calming themselves. Upon completion, the group discussed current concerns and issues. One member noted she has struggled with identifying her feelings. Another noted he had tried to sit quietly for 10 minutes without doing anything, as I had assigned to them, but he had failed miserably. Another member shared her conversation with a member who had left abruptly and angrily on Friday and I pointed out she had actually broken confidentiality by telling this other member what had been said in group. This alerted the members to the seeming innocence of breaking anonymity and reminded them of the importance of keeping things shared here in group to themselves.  Group TimeParticipation   Level: Active  Behavioral Response: Appropriate and Sharing  Type of Therapy: Psycho-education Group  Topic: A handout was provided entitled, "Letting go.". It included the various ways that letting go doesn't mean not caring, but recognizing that one can't change others and enabling does not benefit anyone. Each group member read a statement from the handout and the group discussed the meaning behind each one. Most of the problems members identify include family and loved ones, but the message behind this session included that one must focus on one's self and let go of trying to control others. There was good disclosure and members shared their growing recognition that they must let go, or surrender, and focus on themselves.   Summary: The patient reported she is feeling much better and didn't realize how bad she had really felt last week after being diagnosed with strep, sinus infection, and double ear  infection. The group laughed and agreed that many of them have felt so bad for so long, really feeling good is unusual and amazing. She shared her worries about going back home to [place] and admitted that if it was up to her, she would just have a moving van collect everything and stay here. The group provided good feedback to her and members were very attentive towards helping her identify her true feelings. One member reminded [name] that she has always taken care of everyone else's needs before addressing her own and it was time to change that. She agreed with this observation and admitted learning to identify her feelings was really hard at times. In discussing the handout on "Letting Go...", the patient reported she has tried to control everybody, in some way, for her entire life and it never works out. She agreed that she must learn to let others "affect their destinies. The patient provided good feedback to her fellow group members and continues to make excellent progress in her recovery. Her sobriety date remains July 28th.   Family Program: Family present? No   Name of family member(s):   UDS collected: No Results:  AA/NA attended?: [place] and Tuesday, she was sick over the weekend and did not attend meetings, but spoke with her sponsor.   Sponsor?: Yes   [name], LCAS

## 2012-03-30 ENCOUNTER — Other Ambulatory Visit (HOSPITAL_COMMUNITY): Payer: BC Managed Care – HMO

## 2012-04-02 ENCOUNTER — Other Ambulatory Visit (HOSPITAL_COMMUNITY): Payer: BC Managed Care – HMO

## 2012-04-04 ENCOUNTER — Other Ambulatory Visit (HOSPITAL_COMMUNITY): Payer: BC Managed Care – HMO

## 2012-04-06 ENCOUNTER — Other Ambulatory Visit (HOSPITAL_COMMUNITY): Payer: BC Managed Care – HMO

## 2012-04-07 LAB — PRESCRIPTION ABUSE MONITORING 17P, URINE
Amphetamine/Meth: NEGATIVE ng/mL
Barbiturate Screen, Urine: NEGATIVE ng/mL
Buprenorphine, Urine: NEGATIVE ng/mL
Cannabinoid Scrn, Ur: NEGATIVE ng/mL
Carisoprodol, Urine: NEGATIVE ng/mL
Cocaine Metabolites: NEGATIVE ng/mL
Fentanyl, Ur: NEGATIVE ng/mL
Opiate Screen, Urine: NEGATIVE ng/mL
Oxycodone Screen, Ur: NEGATIVE ng/mL
Tapentadol, urine: NEGATIVE ng/mL
Tramadol Scrn, Ur: NEGATIVE ng/mL

## 2012-04-07 LAB — ALCOHOL METABOLITE (ETG), URINE: Ethyl Glucuronide (EtG): NEGATIVE ng/mL

## 2012-04-11 ENCOUNTER — Other Ambulatory Visit (HOSPITAL_COMMUNITY): Payer: BC Managed Care – HMO

## 2012-04-13 ENCOUNTER — Other Ambulatory Visit (HOSPITAL_COMMUNITY): Payer: BC Managed Care – HMO | Admitting: Psychology

## 2012-04-16 ENCOUNTER — Other Ambulatory Visit (HOSPITAL_COMMUNITY): Payer: BC Managed Care – HMO

## 2012-04-16 NOTE — Progress Notes (Addendum)
    Daily Group Progress Note  Program: CD-IOP   Group Time: 1-2:30 pm  Participation Level: Active  Behavioral Response: Appropriate and Sharing  Type of Therapy: Process Group  Topic: Group Process: first part of group spent in process. Members checked-in and everyone had remained alcohol and drug-free. The remainder of the session was spent discussing current issues and concerns in early recovery. Members were asked to disclose how they were feeling. There was good disclosure among the group.   Group Time: 2:45- 4pm  Participation Level: Active  Behavioral Response: Appropriate  Type of Therapy: Psycho-education Group  Topic: Addiction: The HBO Special: second half of group was spent viewing a brief video clip about addiction and its effects on the brain. The piece came from the HBO special on "Addiction". The piece focused on the effects that drugs have on the dopamine levels in the brain and the way the drugs impact the limbic system. The video proved very informative and generated good discussion among the group.   Summary: The patient returned to the group after having been gone working the [employer] and then getting the stomach flu. She reported she had been a little nervous at first, but she quickly relaxed and enjoyed the work. The patient reported it had helped her self-esteem and raised her confidence. She admitted she is feeling 'scared and helpless". She reported since the divorce is underway, her estranged husband is not sending any money and she has none. She explained that decisions are going to have to be made and she has been in recovery and somewhat removed from the stresses of everyday life. She is also getting more irritated with her sister and their family dynamics. The patient reported she has continued to speak with her sponsor and remains sober since July 28th. She provided good feedback and supported her fellow group members' needs to vent as well.    Family Program: Family present? No   Name of family member(s):   UDS collected: No Results:  AA/NA attended?: YesTuesday  Sponsor?: Yes   [facility]

## 2012-04-17 ENCOUNTER — Encounter (HOSPITAL_COMMUNITY): Payer: Self-pay | Admitting: Psychology
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
## 2012-04-18 ENCOUNTER — Other Ambulatory Visit (HOSPITAL_COMMUNITY): Payer: BC Managed Care – HMO | Admitting: Psychology

## 2012-04-18 DIAGNOSIS — F1920 Other psychoactive substance dependence, uncomplicated: Secondary | ICD-10-CM

## 2012-04-18 NOTE — Progress Notes (Signed)
    Daily Group Progress Note  Program: CD-IOP   Group Time: 1-2:30 pm  Participation Level: Active  Behavioral Response: Appropriate and Sharing  Type of Therapy: Process Group  Topic: Group Process: first half of group was spent in process. Members shared about current struggles and issues in early recovery. No one reported relapsing since the last group session and there was good disclosure and discussion among members.   Group Time: 2:45- 4pm  Participation Level: Active  Behavioral Response: Sharing  Type of Therapy: Psycho-education Group  Topic: Psycho-Ed: second half of group was spent watching a brief video clip from the HBO special on Addiction. The scene included a review of specific brain chemistry issues in early recovery and a lengthy scene of a CBT Group in process. This scene provided a good example of the how and why of group process. Members reported finding the film helpful for them.   Summary:  The patient reported anxiety about her upcoming trip to [place]. She admitted that she continues to be challenged to find new hobbies and interests that are not alcohol related. She provided good feedback to a fellow group member. She watched the CBT video and participated in discussion about triggers, brain chemistry and healing process. The patient made some good comments and continues to make good progress in her recovery.    Family Program: Family present? No   Name of family member(s):   UDS collected: No Results:  AA/NA attended?: YesWednesday and Thursday  Sponsor?: Yes   Veria Stradley, LCAS

## 2012-04-19 NOTE — Progress Notes (Signed)
    Daily Group Progress Note  Program: CD-IOP   Group Time: 1-2:30 pm  Participation Level: Active  Behavioral Response: Appropriate and Sharing  Type of Therapy: Psycho-education Group  Topic:Group Process/Graduation: the second half of group was spent in process. Members shared about current issues and concerns. Two new group members were present and they shared about their lives and what had brought them to treatment. Near the end of the session, a graduation ceremony was held for a member graduating from the program. There were words of wisdom expressed and emphasis by group members that the departing member remain engaged in the 12-step community and that she continue to work her program.    Group Time: 2:45- 4pm  Participation Level: None  Behavioral Response: the patient was quiet and asked to leave when she felt a migraine coming on.   Type of Therapy: Process Group  Topic: Group Process/Graduation: the second half of group was spent in process. Members shared about current issues and concerns. Two new group members were present and they shared about their lives and what had brought them to treatment. Near the end of the session, a graduation ceremony was held for a member graduating from the program. There were words of wisdom expressed and emphasis by group members that the departing member remain engaged in the 12-step community and that she continue to work her program.   Summary:The patient was active in the discussion and easily identified many pros and cons. She laughed and noted how she had once gone to [facility] to play trivia, but ended up staying for hours drinking and waiting for the next group of people to arrive. She admitted that she had looked at some nice apartments, but realized that this same chain restaurant was next door to the complex and she opted against that location. She provided good feedback to another group member and agreed that having  children is a huge stressor on top of early recovery. After the break the patient asked if she could leave because she felt a migraine coming on. I excused her from the group. She made some excellent comments and provided good feedback to the new group members.    Family Program: Family present? No   Name of family member(s):   UDS collected: No Results:  AA/NA attended?: YesTuesday and Wednesday  Sponsor?: Yes   Quentavious Rittenhouse, LCAS

## 2012-04-20 ENCOUNTER — Other Ambulatory Visit (HOSPITAL_COMMUNITY): Payer: BC Managed Care – HMO | Attending: Psychiatry | Admitting: Psychology

## 2012-04-20 DIAGNOSIS — F1020 Alcohol dependence, uncomplicated: Secondary | ICD-10-CM | POA: Insufficient documentation

## 2012-04-20 MED ORDER — RISPERIDONE 0.5 MG PO TABS: ORAL_TABLET | ORAL | Status: DC

## 2012-04-20 NOTE — Progress Notes (Unsigned)
Patient ID: [name], female   DOB: Sep 12, 1960, 51 y.o.   MRN: [id number]  Subjective: [name] asked to be seen because she has been obsessively thinking about sex since getting sober. She reports that this is not new, and has had this since she was in high school. She reports a past behaviors of acting out and sexual ways, and while married would have sex multiple times per day. She also describes some other impulsive behavior such as spending money, and taking trips. She also describes racing thoughts, and a past history of being able to go with minimal sleep for long periods of time.   Objective:  [name] is a well nourished well developed white female in no acute distress who presents with a euthymic mood and congruent affect. She is well groomed and casually dressed. She is alert and oriented x4.   Assessment and Plan: Consider bipolar 1. We will try Risperdal 0.5 mg nightly for a period of 5 days then, if she feels the need to increase, we will increase it to 1 mg nightly.

## 2012-04-23 ENCOUNTER — Other Ambulatory Visit (HOSPITAL_COMMUNITY): Payer: BC Managed Care – HMO | Admitting: Psychology

## 2012-04-23 DIAGNOSIS — F1020 Alcohol dependence, uncomplicated: Secondary | ICD-10-CM

## 2012-04-23 NOTE — Progress Notes (Signed)
    Daily Group Progress Note  Program: CD-IOP   Group Time: 1-2:30 pm  Participation Level: Active  Behavioral Response: Appropriate  Type of Therapy: Process Group  Topic: Group Process: first part of group was spent in process. Members shared about their current issues and concerns. There was good feedback and disclosure among group members. During this part of group, the new group member was invited to meet with the medical director so she left the session accompanied by him. Other members had asked to speak with the director and they were asked to come out of the session one by one as the session progressed.   Group Time: 2:45- 4pm  Participation Level: Active  Behavioral Response: Appropriate and Sharing  Type of Therapy: Psycho-education Group  Topic: The Serenity Prayer; what you can and cannot change. Second half of group was spent in psycho-ed with handout provided and presentation on the Serenity Prayer. Members were asked to complete the handout identifying 5 things they can change and 5 things they cannot change. Members shared their realizations and almost all included that they have this disease or addiction. The group unanimously recognized that they cannot change other people. This topic brought on a lively discussion with good sharing and feedback among the group.  Summary: The patient reported she is doing well, but really struggles with her sister and her recognition of how much her sister tries to control the other family members. She agreed with another member that she feels very fortunate and blessed that she has been able to stay with her sister after entering treatment and doesn't want to damage their relationship by any derogatory remarks. Renee noted that she feels as if her growing sensitively to others indicates she is recovering and gaining more clarity. She reported she is also going to have to get on with her life and head back to [place] in the near  future. She reported that she has planned to return and spend Thanksgiving with her sister and family. The patient was very engaged in the discussion on the Serenity Prayer and reported she accepts that she can't change being an alcoholic. She noted that she can change herself and be more responsible and reliable than she has in the past few years. The patient continues to make excellent progress and responded well to this intervention.   Family Program: Family present? No   Name of family member(s):   UDS collected: No Results:   AA/NA attended?: YesMonday, Tuesday, Wednesday and Thursday  Sponsor?: Yes   Mayanna Garlitz, LCAS

## 2012-04-24 ENCOUNTER — Encounter (HOSPITAL_COMMUNITY): Payer: Self-pay | Admitting: Psychology

## 2012-04-24 NOTE — Progress Notes (Signed)
    Daily Group Progress Note  Program: CD-IOP   Group Time: 1-2:30 pm  Participation Level: Active  Behavioral Response: Appropriate and Sharing  Type of Therapy: Process Group  Topic: Group Process: first half of group spent in process. Members shared about their weekends and events and activities spent supporting abstinence. One group member admitted she had drunk on Friday, Saturday, and Sunday. Her sobriety date was today. This relapse was charted on the board and the member agreed that she had been struggling with very uncomfortable feelings. There was good support and disclosure among the group.   Group Time: 2:45-4pm  Participation Level: Active  Behavioral Response: Appropriate and Sharing  Type of Therapy: Psycho-education Group  Topic: Building a recovery plan/graduation/introduction: second half of group as spent discussing identifying the things one needs to do to remain abstinent and the things to be avoided. Members were provided with a handout and instructed to complete and bring with them when they return on Wednesday. A new group member was present this afternoon and she was asked to introduce herself. She received good feedback and was warmly welcomed. Near the conclusion of the session a graduation ceremony was held for a group member who had successfully completed the program. There were kind words of validation and hope for the future and the graduating member was clearly touched by these words.   Summary: The patient reported she had had a great weekend. She went to a fundraiser on Saturday morning with her sister and best friend. It was geared toward raising funds for early [facility] detection. She attended a meeting on Saturday evening and then Sunday. The patient encouraged her fellow group members to go to meetings and noted that she has found that she really enjoys them. She noted that the times she is resisting going, but eventually goes, are the sessions that prove  most meaningful to her. The patient offered good feedback to the new group member and pointed out that she had felt much the same when she lost her job. This patient continues to make excellent progress and remains alcohol-free with a sobriety date of July 28th.    Family Program: Family present? No   Name of family member(s):   UDS collected: No Results:   AA/NA attended?: YesMonday, Tuesday, Wednesday, Thursday, Friday, Saturday and Sunday  Sponsor?: Yes   Atziry Baranski, LCAS

## 2012-04-25 ENCOUNTER — Other Ambulatory Visit (HOSPITAL_COMMUNITY): Payer: BC Managed Care – HMO

## 2012-04-27 ENCOUNTER — Other Ambulatory Visit (HOSPITAL_COMMUNITY): Payer: BC Managed Care – HMO

## 2012-04-30 ENCOUNTER — Other Ambulatory Visit (HOSPITAL_COMMUNITY): Payer: BC Managed Care – HMO

## 2012-05-02 ENCOUNTER — Other Ambulatory Visit (HOSPITAL_COMMUNITY): Payer: BC Managed Care – HMO

## 2012-05-04 ENCOUNTER — Other Ambulatory Visit (HOSPITAL_COMMUNITY): Payer: BC Managed Care – HMO

## 2012-12-14 ENCOUNTER — Encounter (HOSPITAL_COMMUNITY): Payer: Self-pay | Admitting: Emergency Medicine

## 2012-12-14 ENCOUNTER — Emergency Department (HOSPITAL_COMMUNITY)
Admission: EM | Admit: 2012-12-14 | Discharge: 2012-12-15 | Disposition: A | Discharge status: Other Acute Inpatient Hospital | Payer: BC Managed Care – HMO | Attending: Emergency Medicine | Admitting: Emergency Medicine

## 2012-12-14 DIAGNOSIS — F29 Unspecified psychosis not due to a substance or known physiological condition: Secondary | ICD-10-CM | POA: Insufficient documentation

## 2012-12-14 DIAGNOSIS — Z862 Personal history of diseases of the blood and blood-forming organs and certain disorders involving the immune mechanism: Secondary | ICD-10-CM | POA: Insufficient documentation

## 2012-12-14 DIAGNOSIS — Z8619 Personal history of other infectious and parasitic diseases: Secondary | ICD-10-CM | POA: Insufficient documentation

## 2012-12-14 DIAGNOSIS — F1020 Alcohol dependence, uncomplicated: Secondary | ICD-10-CM

## 2012-12-14 DIAGNOSIS — R45851 Suicidal ideations: Secondary | ICD-10-CM

## 2012-12-14 DIAGNOSIS — Z79899 Other long term (current) drug therapy: Secondary | ICD-10-CM | POA: Insufficient documentation

## 2012-12-14 DIAGNOSIS — Z8742 Personal history of other diseases of the female genital tract: Secondary | ICD-10-CM | POA: Insufficient documentation

## 2012-12-14 DIAGNOSIS — Z880 Allergy status to penicillin: Secondary | ICD-10-CM | POA: Insufficient documentation

## 2012-12-14 DIAGNOSIS — D510 Vitamin B12 deficiency anemia due to intrinsic factor deficiency: Secondary | ICD-10-CM | POA: Insufficient documentation

## 2012-12-14 DIAGNOSIS — F329 Major depressive disorder, single episode, unspecified: Secondary | ICD-10-CM | POA: Insufficient documentation

## 2012-12-14 DIAGNOSIS — F32A Depression, unspecified: Secondary | ICD-10-CM

## 2012-12-14 DIAGNOSIS — Z8709 Personal history of other diseases of the respiratory system: Secondary | ICD-10-CM | POA: Insufficient documentation

## 2012-12-14 DIAGNOSIS — F988 Other specified behavioral and emotional disorders with onset usually occurring in childhood and adolescence: Secondary | ICD-10-CM | POA: Insufficient documentation

## 2012-12-14 DIAGNOSIS — F17200 Nicotine dependence, unspecified, uncomplicated: Secondary | ICD-10-CM | POA: Insufficient documentation

## 2012-12-14 DIAGNOSIS — Z8639 Personal history of other endocrine, nutritional and metabolic disease: Secondary | ICD-10-CM | POA: Insufficient documentation

## 2012-12-14 DIAGNOSIS — F3289 Other specified depressive episodes: Secondary | ICD-10-CM | POA: Insufficient documentation

## 2012-12-14 LAB — COMPREHENSIVE METABOLIC PANEL
AST: 15 U/L (ref 0–37)
CO2: 30 mEq/L (ref 19–32)
Chloride: 101 mEq/L (ref 96–112)
Creatinine, Ser: 0.85 mg/dL (ref 0.50–1.10)
GFR calc non Af Amer: 77 mL/min — ABNORMAL LOW (ref >90)
Total Bilirubin: 0.3 mg/dL (ref 0.3–1.2)

## 2012-12-14 LAB — CBC
HCT: 40.6 % (ref 36.0–46.0)
Hemoglobin: 14.2 g/dL (ref 12.0–15.0)
MCV: 97.8 fL (ref 78.0–100.0)
Platelets: 256 10*3/uL (ref 150–400)
RBC: 4.15 MIL/uL (ref 3.87–5.11)
WBC: 7.7 10*3/uL (ref 4.0–10.5)

## 2012-12-14 LAB — URINALYSIS, ROUTINE W REFLEX MICROSCOPIC
Glucose, UA: NEGATIVE mg/dL
Leukocytes, UA: NEGATIVE
Nitrite: NEGATIVE
Protein, ur: NEGATIVE mg/dL
Urobilinogen, UA: 0.2 mg/dL (ref 0.0–1.0)

## 2012-12-14 LAB — RAPID URINE DRUG SCREEN, HOSP PERFORMED
Amphetamines: NOT DETECTED
Tetrahydrocannabinol: NOT DETECTED

## 2012-12-14 LAB — SALICYLATE LEVEL: Salicylate Lvl: <2 mg/dL — ABNORMAL LOW (ref 2.8–20.0)

## 2012-12-14 MED ORDER — CITALOPRAM HYDROBROMIDE 40 MG PO TABS
40.0000 mg | ORAL_TABLET | Freq: Every day | ORAL | Status: DC
  Administered 2012-12-14: 40 mg via ORAL
  Filled 2012-12-14: qty 1

## 2012-12-14 MED ORDER — ZOLPIDEM TARTRATE 5 MG PO TABS: 5.0000 mg | ORAL_TABLET | Freq: Every evening | ORAL | Status: DC | PRN

## 2012-12-14 MED ORDER — IBUPROFEN 600 MG PO TABS: 600.0000 mg | ORAL_TABLET | Freq: Three times a day (TID) | ORAL | Status: DC | PRN

## 2012-12-14 MED ORDER — LORAZEPAM 1 MG PO TABS: 1.0000 mg | ORAL_TABLET | Freq: Three times a day (TID) | ORAL | Status: DC | PRN

## 2012-12-14 MED ORDER — ONDANSETRON HCL 4 MG PO TABS: 4.0000 mg | ORAL_TABLET | Freq: Three times a day (TID) | ORAL | Status: DC | PRN

## 2012-12-14 MED ORDER — NICOTINE 21 MG/24HR TD PT24
21.0000 mg | MEDICATED_PATCH | Freq: Every day | TRANSDERMAL | Status: DC
  Administered 2012-12-14: 21 mg via TRANSDERMAL
  Filled 2012-12-14: qty 1

## 2012-12-14 MED ORDER — KETOCONAZOLE 2 % EX SHAM: 1.0000 "application " | MEDICATED_SHAMPOO | CUTANEOUS | Status: DC

## 2012-12-14 MED ORDER — BUPROPION HCL ER (XL) 300 MG PO TB24
300.0000 mg | ORAL_TABLET | Freq: Every day | ORAL | Status: DC
  Administered 2012-12-14: 300 mg via ORAL
  Filled 2012-12-14: qty 1

## 2012-12-14 MED ORDER — LORATADINE 10 MG PO TABS
10.0000 mg | ORAL_TABLET | Freq: Every day | ORAL | Status: DC
  Administered 2012-12-14: 10 mg via ORAL
  Filled 2012-12-14: qty 1

## 2012-12-14 MED ORDER — VITAMIN D3 25 MCG (1000 UNIT) PO TABS
1000.0000 [IU] | ORAL_TABLET | Freq: Every day | ORAL | Status: DC
  Administered 2012-12-14: 1000 [IU] via ORAL
  Filled 2012-12-14: qty 1
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
## 2012-12-14 NOTE — ED Notes (Signed)
Patient in blue scrubs and red socks.  

## 2012-12-14 NOTE — ED Notes (Signed)
Patient has 2 bag of belongings located behind nurses station in triage.

## 2012-12-14 NOTE — ED Provider Notes (Signed)
PT has been accepted to [facility] by Dr Kathrine Cords, MD 12/14/12 2333
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
## 2012-12-14 NOTE — ED Notes (Signed)
Pt is from home and went over to [facility] due to her 2 sons brought her in for ETOH abuse with some depression and SI at times none at this time. Pt is calm and vol here. Was seen a few years ago at [facility] and thought it helped some.

## 2012-12-14 NOTE — ED Notes (Signed)
Pt aaox3.  Pt laying in bed.  Pt denies SI/HI at this time.  Pt reports "If was out, i would probably shoot myself or run my car in the water"  Pt reports hx SI without a plan.  Pt denies any history of SI attempt in past.  Pt reports an extensive hx of depression.  Pt reports "I take all of my meds, I have just been under a lot of stress with relocating and the divorce from my husband." pt does contract for safety.

## 2012-12-14 NOTE — Progress Notes (Signed)
Patient has been accepted  At [facility] by Maryjean Morn PA to the services of Dr. Dub Mikes, room 303.1

## 2012-12-15 ENCOUNTER — Encounter (HOSPITAL_COMMUNITY): Payer: Self-pay | Admitting: *Deleted

## 2012-12-15 ENCOUNTER — Inpatient Hospital Stay (HOSPITAL_COMMUNITY)
Admission: AD | Admit: 2012-12-15 | Discharge: 2012-12-21 | DRG: 750 | Disposition: A | Discharge status: Home / Self Care | Payer: BC Managed Care – HMO | Source: Intra-hospital | Attending: Psychiatry | Admitting: Psychiatry

## 2012-12-15 DIAGNOSIS — F1020 Alcohol dependence, uncomplicated: Principal | ICD-10-CM | POA: Diagnosis present

## 2012-12-15 DIAGNOSIS — F332 Major depressive disorder, recurrent severe without psychotic features: Secondary | ICD-10-CM

## 2012-12-15 DIAGNOSIS — Z79899 Other long term (current) drug therapy: Secondary | ICD-10-CM

## 2012-12-15 DIAGNOSIS — F988 Other specified behavioral and emotional disorders with onset usually occurring in childhood and adolescence: Secondary | ICD-10-CM | POA: Diagnosis present

## 2012-12-15 MED ORDER — THIAMINE HCL 100 MG/ML IJ SOLN: 100.0000 mg | Freq: Once | INTRAMUSCULAR | Status: DC

## 2012-12-15 MED ORDER — NICOTINE 21 MG/24HR TD PT24
21.0000 mg | MEDICATED_PATCH | Freq: Every day | TRANSDERMAL | Status: DC
  Administered 2012-12-16 – 2012-12-21 (×6): 21 mg via TRANSDERMAL
  Filled 2012-12-15 (×10): qty 1

## 2012-12-15 MED ORDER — CHLORDIAZEPOXIDE HCL 25 MG PO CAPS: 25.0000 mg | ORAL_CAPSULE | Freq: Once | ORAL | Status: DC

## 2012-12-15 MED ORDER — LOPERAMIDE HCL 2 MG PO CAPS: 2.0000 mg | ORAL_CAPSULE | ORAL | Status: AC | PRN

## 2012-12-15 MED ORDER — CHLORDIAZEPOXIDE HCL 25 MG PO CAPS
25.0000 mg | ORAL_CAPSULE | Freq: Four times a day (QID) | ORAL | Status: AC
  Administered 2012-12-15 – 2012-12-16 (×2): 25 mg via ORAL
  Filled 2012-12-15 (×3): qty 1

## 2012-12-15 MED ORDER — TRAZODONE HCL 50 MG PO TABS
50.0000 mg | ORAL_TABLET | Freq: Every day | ORAL | Status: DC
  Administered 2012-12-15: 50 mg via ORAL
  Filled 2012-12-15 (×4): qty 1

## 2012-12-15 MED ORDER — VITAMIN B-1 100 MG PO TABS
100.0000 mg | ORAL_TABLET | Freq: Every day | ORAL | Status: DC
  Administered 2012-12-16 – 2012-12-21 (×6): 100 mg via ORAL
  Filled 2012-12-15 (×8): qty 1

## 2012-12-15 MED ORDER — CHLORDIAZEPOXIDE HCL 25 MG PO CAPS
25.0000 mg | ORAL_CAPSULE | Freq: Three times a day (TID) | ORAL | Status: AC
  Administered 2012-12-16 (×3): 25 mg via ORAL
  Filled 2012-12-15 (×2): qty 1

## 2012-12-15 MED ORDER — CITALOPRAM HYDROBROMIDE 40 MG PO TABS
40.0000 mg | ORAL_TABLET | Freq: Every day | ORAL | Status: DC
  Administered 2012-12-15 – 2012-12-21 (×7): 40 mg via ORAL
  Filled 2012-12-15 (×8): qty 1
  Filled 2012-12-15: qty 3
  Filled 2012-12-15: qty 1

## 2012-12-15 MED ORDER — ONDANSETRON HCL 4 MG PO TABS: 4.0000 mg | ORAL_TABLET | Freq: Three times a day (TID) | ORAL | Status: DC | PRN

## 2012-12-15 MED ORDER — CHLORDIAZEPOXIDE HCL 25 MG PO CAPS
25.0000 mg | ORAL_CAPSULE | Freq: Every day | ORAL | Status: AC
  Administered 2012-12-18: 25 mg via ORAL
  Filled 2012-12-15: qty 1

## 2012-12-15 MED ORDER — ONDANSETRON 4 MG PO TBDP: 4.0000 mg | ORAL_TABLET | Freq: Four times a day (QID) | ORAL | Status: AC | PRN

## 2012-12-15 MED ORDER — TRAZODONE HCL 50 MG PO TABS: 50.0000 mg | ORAL_TABLET | Freq: Every evening | ORAL | Status: DC | PRN

## 2012-12-15 MED ORDER — IBUPROFEN 600 MG PO TABS
600.0000 mg | ORAL_TABLET | Freq: Three times a day (TID) | ORAL | Status: DC | PRN
  Administered 2012-12-16 – 2012-12-21 (×2): 600 mg via ORAL
  Filled 2012-12-15 (×2): qty 1

## 2012-12-15 MED ORDER — BUPROPION HCL ER (XL) 300 MG PO TB24
300.0000 mg | ORAL_TABLET | Freq: Every day | ORAL | Status: DC
  Administered 2012-12-15 – 2012-12-21 (×7): 300 mg via ORAL
  Filled 2012-12-15 (×8): qty 1
  Filled 2012-12-15: qty 3
  Filled 2012-12-15 (×2): qty 1

## 2012-12-15 MED ORDER — HYDROXYZINE HCL 25 MG PO TABS: 25.0000 mg | ORAL_TABLET | Freq: Four times a day (QID) | ORAL | Status: AC | PRN

## 2012-12-15 MED ORDER — CHLORDIAZEPOXIDE HCL 25 MG PO CAPS
25.0000 mg | ORAL_CAPSULE | ORAL | Status: AC
  Administered 2012-12-17 (×2): 25 mg via ORAL
  Filled 2012-12-15 (×2): qty 1

## 2012-12-15 MED ORDER — MAGNESIUM HYDROXIDE 400 MG/5ML PO SUSP: 30.0000 mL | Freq: Every day | ORAL | Status: DC | PRN

## 2012-12-15 MED ORDER — ACETAMINOPHEN 325 MG PO TABS
650.0000 mg | ORAL_TABLET | Freq: Four times a day (QID) | ORAL | Status: DC | PRN
Start: 2012-12-15 — End: 2012-12-21

## 2012-12-15 MED ORDER — CHLORDIAZEPOXIDE HCL 25 MG PO CAPS: 25.0000 mg | ORAL_CAPSULE | Freq: Four times a day (QID) | ORAL | Status: AC | PRN

## 2012-12-15 MED ORDER — ADULT MULTIVITAMIN W/MINERALS CH
1.0000 | ORAL_TABLET | Freq: Every day | ORAL | Status: DC
  Administered 2012-12-15 – 2012-12-21 (×7): 1 via ORAL
  Filled 2012-12-15 (×9): qty 1

## 2012-12-15 MED ORDER — LORATADINE 10 MG PO TABS
10.0000 mg | ORAL_TABLET | Freq: Every day | ORAL | Status: DC
  Administered 2012-12-16 – 2012-12-21 (×6): 10 mg via ORAL
  Filled 2012-12-15 (×9): qty 1

## 2012-12-15 MED ORDER — ALUM & MAG HYDROXIDE-SIMETH 200-200-20 MG/5ML PO SUSP: 30.0000 mL | ORAL | Status: DC | PRN

## 2012-12-15 MED ORDER — VITAMIN D3 25 MCG (1000 UNIT) PO TABS
1000.0000 [IU] | ORAL_TABLET | Freq: Every day | ORAL | Status: DC
  Administered 2012-12-15 – 2012-12-21 (×7): 1000 [IU] via ORAL
  Filled 2012-12-15 (×5): qty 1
  Filled 2012-12-15: qty 3
  Filled 2012-12-15 (×3): qty 1
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
## 2012-12-15 NOTE — Progress Notes (Signed)
Psychoeducational Group Note  Date:  12/15/2012 Time:  0945 am  Group Topic/Focus:  Identifying Needs:   The focus of this group is to help patients identify their personal needs that have been historically problematic and identify healthy behaviors to address their needs.  Participation Level:  Active  Participation Quality:  Appropriate, Attentive, Sharing and Supportive  Affect:  Anxious and Appropriate  Cognitive:  Alert and Appropriate  Insight:  Engaged  Engagement in Group:  Engaged  Additional Comments:    [name] 12/15/2012,10:11 AM

## 2012-12-15 NOTE — Progress Notes (Signed)
Adult Psychoeducational Group Note  Date:  12/15/2012 Time:  8:00PM Group Topic/Focus:  Wrap-Up Group:   The focus of this group is to help patients review their daily goal of treatment and discuss progress on daily workbooks.  Participation Level:  Active  Participation Quality:  Appropriate and Attentive  Affect:  Appropriate  Cognitive:  Alert and Appropriate  Insight: Appropriate  Engagement in Group:  Engaged  Modes of Intervention:  Discussion  Additional Comments:  Pt attended AA meeting.   Bing Plume D 12/15/2012, 8:13 PM

## 2012-12-15 NOTE — [facility] Group Notes (Signed)
[facility] Group Notes: (Clinical Social Work)   12/15/2012      Type of Therapy:  Group Therapy   Participation Level:  Did Not Attend - Was in group at beginning but was called out and did not return.   Ambrose Mantle, LCSW 12/15/2012, 4:30 PM
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
## 2012-12-15 NOTE — [facility] Suicide Risk Assessment (Signed)
Suicide Risk Assessment  Admission Assessment     Information obtained from: Patient Demographic factors:   Current Mental Status per Physician Patient denies suicidal or homicidal ideation, hallucinations, illusions, or delusions. Patient engages with good eye contact, is able to focus adequately in a one to one setting, and has clear goal directed thoughts. Patient speaks with a natural conversational volume, rate, and tone. Anxiety was reported at 0 on a scale of 1 the least and 10 the most. Depression was reported at 10 on the same scale. Patient is oriented times 4, recent and remote memory intact. Judgement: impaired by her mental illness and addictive thinking Insight: impaired by her mental illness and addictive thinking  Loss Factors: Loss of significant relationship;Legal issues Historical Factors: Family history of suicide;Family history of mental illness or substance abuse Risk Reduction Factors: Living with another person, especially a relative;Sense of responsibility to family  CLINICAL FACTORS:   Dysthymia Alcohol/Substance Abuse/Dependencies  COGNITIVE FEATURES THAT CONTRIBUTE TO RISK:  Closed-mindedness Polarized thinking Thought constriction (tunnel vision)    SUICIDE RISK:   Severe:  Frequent, intense, and enduring suicidal ideation, specific plan, no subjective intent, but some objective markers of intent (i.e., choice of lethal method), the method is accessible, some limited preparatory behavior, evidence of impaired self-control, severe dysphoria/symptomatology, multiple risk factors present, and few if any protective factors, particularly a lack of social support.  PLAN OF CARE: 1 Admit for crisis management and stabilization.  Estimate length of stay is 3 to 5 days.  2. Medication management to reduce current symptoms to base line and improve the patient's overall level of functioning. Monitor for suicidal thinking.  3. Treat health problems as  indicated:  4. Develop treatment plan to decrease risk of relapse upon discharge and the need for readmission.  5. Psycho-social education regarding relapse prevention and self-care.  6. Review and reinstate any pertinent home medication for other health issues where appropriate.  7. Call for Consult with [facility] for additional specialty patient services as needed.  I certify that inpatient services furnished can reasonably be expected to improve the patient's condition.  Orson Aloe, MD, [facility] 12/15/2012 11:07 AM

## 2012-12-15 NOTE — Progress Notes (Signed)
Adult Psychoeducational Group Note  Date:  12/15/2012 Time:  7:21 PM  Group Topic/Focus:  Healthy Communication:   The focus of this group is to discuss communication, barriers to communication, as well as healthy ways to communicate with others.  Participation Level:  Active  Participation Quality:  Appropriate, Attentive, Sharing and Supportive  Affect:  Appropriate  Cognitive:  Alert, Appropriate and Oriented  Insight: Appropriate  Engagement in Group:  Developing/Improving, Engaged, Improving and Supportive  Modes of Intervention:  Discussion, Education, Socialization and Support  Additional Comments:  The purpose of this group was to provide patients with healthy communication skills. Pt was very supportive during group. Pt stated that she is glad she is here and really appreciates all the encouragement and support provided.  Caswell Corwin 12/15/2012, 7:21 PM

## 2012-12-15 NOTE — Tx Team (Signed)
Initial Interdisciplinary Treatment Plan  PATIENT STRENGTHS: (choose at least two) Ability for insight Average or above average intelligence Capable of independent living Motivation for treatment/growth Supportive family/friends Work skills  PATIENT STRESSORS: Legal issue Marital or family conflict Substance abuse   PROBLEM LIST: Problem List/Patient Goals Date to be addressed Date deferred Reason deferred Estimated date of resolution  Depression 12/15/12     ETOH abuse 12/15/12     Suicidal Ideation 12/15/12                                          DISCHARGE CRITERIA:  Improved stabilization in mood, thinking, and/or behavior Motivation to continue treatment in a less acute level of care Need for constant or close observation no longer present Verbal commitment to aftercare and medication compliance Withdrawal symptoms are absent or subacute and managed without 24-hour nursing intervention  PRELIMINARY DISCHARGE PLAN: Attend 12-step recovery group Outpatient therapy Return to previous living arrangement  PATIENT/FAMIILY INVOLVEMENT: This treatment plan has been presented to and reviewed with the patient, [name].  The patient and family have been given the opportunity to ask questions and make suggestions.  Dianne, [place] 12/15/2012, 1:32 AM

## 2012-12-15 NOTE — Progress Notes (Signed)
D.  Pt pleasant and appropriate on approach, sitting in room reading book.  Denies s/s of withdrawal at this time.  Denies SI/HI/hallucinations at present, hopeful to get her depression addressed and under control.  Pt was positive for evening  AA group with appropriate participation.  Interacting appropriately within milieu.  A.  Support and encouragement offered R.  Pt remains safe on unit, will continue to monitor.

## 2012-12-15 NOTE — Progress Notes (Signed)
Psychoeducational Group Note  Date:  12/15/2012 Time:  0945 am  Group Topic/Focus:  Identifying Needs:   The focus of this group is to help patients identify their personal needs that have been historically problematic and identify healthy behaviors to address their needs.  Participation Level:  Active  Participation Quality:  Appropriate  Affect:  Appropriate  Cognitive:  Alert and Appropriate  Insight:  Developing/Improving  Engagement in Group:  Developing/Improving  Additional Comments:    [name] 12/15/2012,3:26 PM

## 2012-12-15 NOTE — H&P (Signed)
I agree with this note. I certify that inpatient services furnished can reasonably be expected to improve the patient's condition. Anthonia Monger, MD, [facility]  

## 2012-12-15 NOTE — Progress Notes (Signed)
D   Pt is pleasant and cooperative  She is vested in her treatment and has good insight into her illness   She attends and participates in groups and interacts well with others  She denies signs and symptoms of withdrawal at present A   Verbal support given   Medications administered and effectiveness monitored   Q 15 min checks R   Pt safe at present

## 2012-12-16 MED ORDER — TRAZODONE HCL 100 MG PO TABS
100.0000 mg | ORAL_TABLET | Freq: Every day | ORAL | Status: DC
  Administered 2012-12-17 – 2012-12-19 (×2): 100 mg via ORAL
  Filled 2012-12-16 (×6): qty 1
  Filled 2012-12-16: qty 3
  Filled 2012-12-16 (×2): qty 1

## 2012-12-16 NOTE — Progress Notes (Signed)
Psychoeducational Group Note  Date:  12/16/2012 Time:  0945 am  Group Topic/Focus:  Making Healthy Choices:   The focus of this group is to help patients identify negative/unhealthy choices they were using prior to admission and identify positive/healthier coping strategies to replace them upon discharge.  Participation Level:  Did Not Attend   Mariha Sleeper J 12/16/2012, 10:29 AM 

## 2012-12-16 NOTE — Progress Notes (Signed)
[facility] MD Progress Note  12/16/2012 2:53 PM [name]  MRN:  [id number] Subjective: 10/10 depression and anxiety with suicidal thoughts--her sleep was "not good" and she fell asleep after breakfast until lunch.  She has attended all the groups since then, Trazodone increased.  Ruminating on negative thoughts, encouraged her to redirect these thoughts-set her goal (get a job)-write them down and steps to get to her goal.  She is also interested in IOP for depression after discharge due to feeling she needs support and needs to be busy to prevent relapse of drinking and suicidal thoughts.  [name] was also encouraged to make a list of things she is grateful for and to remember 3 of these right before going to sleep to help divert her negative thoughts to positive ones.  Diagnosis:   Axis I: Alcohol Abuse and Major Depression, Recurrent severe Axis II: Deferred Axis III:  Past Medical History  Diagnosis Date  . Depression   . Elevated testosterone level in female   . Pernicious anemia   . ADD (attention deficit disorder)   . ETOH abuse   . Lyme disease   . Sinus infection 02/10/2012    Pt reports this as a current problem  . Menopause 02/10/2012   Axis IV: economic problems, occupational problems, other psychosocial or environmental problems, problems related to legal system/crime, problems related to social environment and problems with primary support group Axis V: 41-50 serious symptoms  ADL's:  Intact  Sleep: Fair  Appetite:  Fair  Suicidal Ideation:  Plan:  None Intent:  None Means:  None Homicidal Ideation:  Denies  Psychiatric Specialty Exam: Review of Systems  Constitutional: Negative.   Eyes: Negative.   Respiratory: Negative.   Cardiovascular: Negative.   Gastrointestinal: Negative.   Genitourinary: Negative.   Musculoskeletal: Negative.   Skin: Negative.   Neurological: Positive for headaches.  Endo/Heme/Allergies: Negative.   Psychiatric/Behavioral:  Positive for depression, suicidal ideas and substance abuse. The patient is nervous/anxious.     Blood pressure 103/70, pulse 92, temperature 97.7 F (36.5 C), temperature source Oral, resp. rate 18, height 5\' 6"  (1.676 m), weight 71.215 kg (157 lb).Body mass index is 25.35 kg/(m^2).  General Appearance: Casual  Eye Contact::  Fair  Speech:  Normal Rate  Volume:  Normal  Mood:  Depressed, anxious  Affect:  Non-Congruent  Thought Process:  Coherent  Orientation:  Full (Time, Place, and Person)  Thought Content:  WDL  Suicidal Thoughts:  Yes.  without intent/plan  Homicidal Thoughts:  No  Memory:  Immediate;   Fair Recent;   Fair Remote;   Fair  Judgement:  Fair  Insight:  Fair  Psychomotor Activity:  Decreased  Concentration:  Fair  Recall:  Fair  Akathisia:  No  Handed:  Right  AIMS (if indicated):     Assets:  Communication Skills Resilience Social Support  Sleep:  Number of Hours: 6.25   Current Medications: Current Facility-Administered Medications  Medication Dose Route Frequency Provider Last Rate Last Dose  . acetaminophen (TYLENOL) tablet 650 mg  650 mg Oral Q6H PRN Court Joy, PA-C      . alum & mag hydroxide-simeth (MAALOX/MYLANTA) 200-200-20 MG/5ML suspension 30 mL  30 mL Oral Q4H PRN Court Joy, PA-C      . buPROPion (WELLBUTRIN XL) 24 hr tablet 300 mg  300 mg Oral Daily Court Joy, PA-C   300 mg at 12/16/12 0756  . chlordiazePOXIDE (LIBRIUM) capsule 25 mg  25 mg Oral Q6H PRN  Court Joy, PA-C      . chlordiazePOXIDE (LIBRIUM) capsule 25 mg  25 mg Oral Once Court Joy, PA-C      . chlordiazePOXIDE (LIBRIUM) capsule 25 mg  25 mg Oral TID Court Joy, PA-C   25 mg at 12/16/12 1249   Followed by  . [START ON 12/17/2012] chlordiazePOXIDE (LIBRIUM) capsule 25 mg  25 mg Oral BH-qamhs Court Joy, PA-C       Followed by  . [START ON 12/18/2012] chlordiazePOXIDE (LIBRIUM) capsule 25 mg  25 mg Oral Daily Court Joy, PA-C      .  cholecalciferol (VITAMIN D) tablet 1,000 Units  1,000 Units Oral Daily Court Joy, PA-C   1,000 Units at 12/16/12 0755  . citalopram (CELEXA) tablet 40 mg  40 mg Oral Daily Court Joy, PA-C   40 mg at 12/16/12 0756  . hydrOXYzine (ATARAX/VISTARIL) tablet 25 mg  25 mg Oral Q6H PRN Court Joy, PA-C      . ibuprofen (ADVIL,MOTRIN) tablet 600 mg  600 mg Oral Q8H PRN Court Joy, PA-C      . loperamide (IMODIUM) capsule 2-4 mg  2-4 mg Oral PRN Court Joy, PA-C      . loratadine (CLARITIN) tablet 10 mg  10 mg Oral Daily Court Joy, PA-C   10 mg at 12/16/12 0756  . magnesium hydroxide (MILK OF MAGNESIA) suspension 30 mL  30 mL Oral Daily PRN Court Joy, PA-C      . multivitamin with minerals tablet 1 tablet  1 tablet Oral Daily Court Joy, PA-C   1 tablet at 12/16/12 0756  . nicotine (NICODERM CQ - dosed in mg/24 hours) patch 21 mg  21 mg Transdermal Daily Court Joy, PA-C   21 mg at 12/16/12 0810  . ondansetron (ZOFRAN) tablet 4 mg  4 mg Oral Q8H PRN Court Joy, PA-C      . ondansetron (ZOFRAN-ODT) disintegrating tablet 4 mg  4 mg Oral Q6H PRN Court Joy, PA-C      . thiamine (B-1) injection 100 mg  100 mg Intramuscular Once Court Joy, PA-C      . thiamine (VITAMIN B-1) tablet 100 mg  100 mg Oral Daily Court Joy, PA-C   100 mg at 12/16/12 0756  . traZODone (DESYREL) tablet 100 mg  100 mg Oral QHS Nanine Means, NP        Lab Results: No results found for this or any previous visit (from the past 48 hour(s)).  Physical Findings: AIMS: Facial and Oral Movements Muscles of Facial Expression: None, normal Lips and Perioral Area: None, normal Jaw: None, normal Tongue: None, normal,Extremity Movements Upper (arms, wrists, hands, fingers): None, normal Lower (legs, knees, ankles, toes): None, normal, Trunk Movements Neck, shoulders, hips: None, normal, Overall Severity Severity of abnormal movements (highest score from questions above):  None, normal Incapacitation due to abnormal movements: None, normal Patient's awareness of abnormal movements (rate only patient's report): No Awareness, Dental Status Current problems with teeth and/or dentures?: No Does patient usually wear dentures?: No  CIWA:  CIWA-Ar Total: 0 COWS:     Treatment Plan Summary: Daily contact with patient to assess and evaluate symptoms and progress in treatment Medication management  Plan:  Review of chart, vital signs, medications, and notes. 1-Individual and group therapy 2-Medication management for depression, alcohol detox, and anxiety:  Medications reviewed with the patient and she stated sleep was not good,  Trazodone increased 3-Coping skills for depression, anxiety, and alcohol dependency 4-Continue crisis stabilization and management 5-Address health issues--monitoring vital signs, stable 6-Treatment plan in progress to prevent relapse of depression, alcohol abuse, and anxiety  Medical Decision Making Problem Points:  Established problem, stable/improving (1) and Review of psycho-social stressors (1) Data Points:  Review of medication regiment & side effects (2)  I certify that inpatient services furnished can reasonably be expected to improve the patient's condition.   Nanine Means, PMH-NP 12/16/2012, 2:53 PM

## 2012-12-16 NOTE — Progress Notes (Signed)
Psychoeducational Group Note  Date:  12/16/2012 Time:  0945 am  Group Topic/Focus:  Making Healthy Choices:   The focus of this group is to help patients identify negative/unhealthy choices they were using prior to admission and identify positive/healthier coping strategies to replace them upon discharge.  Participation Level:  Active  Participation Quality:  Appropriate and Attentive  Affect:  Anxious and Appropriate  Cognitive:  Alert and Appropriate  Insight:  Engaged  Engagement in Group:  Engaged  Additional Comments:    [name] 12/16/2012, 10:29 AM

## 2012-12-16 NOTE — [facility] Group Notes (Signed)
[facility] Group Notes: (Clinical Social Work)   12/16/2012      Type of Therapy:  Group Therapy   Participation Level:  Did Not Attend    Ambrose Mantle, LCSW 12/16/2012, 11:11 AM

## 2012-12-16 NOTE — Progress Notes (Signed)
D.  Pt has been in bed sleeping for duration of shift.  She had a headache earlier and was not feeling well.  Pt also has taken her scheduled Librium today while she had been refusing it and this also made her drowsy.  Pt did not attend evening AA group for the above reasons.  A.  Will continue to monitor.  R.  Pt remains safe on unit

## 2012-12-16 NOTE — Progress Notes (Signed)
D   Pt is pleasant and cooperative  She is vested in her treatment and has good insight into her illness   She attends and participates in groups and interacts well with others  She denies signs and symptoms of withdrawal at present  Pt reports some intrusive thoughts and racing thoughts as well A   Verbal support given   Medications administered and effectiveness monitored   Q 15 min checks   Pt was encouraged to talk with provider about thought processes R   Pt safe at present

## 2012-12-17 DIAGNOSIS — F1010 Alcohol abuse, uncomplicated: Secondary | ICD-10-CM

## 2012-12-17 DIAGNOSIS — F332 Major depressive disorder, recurrent severe without psychotic features: Secondary | ICD-10-CM

## 2012-12-17 MED ORDER — QUETIAPINE FUMARATE 25 MG PO TABS
25.0000 mg | ORAL_TABLET | Freq: Every day | ORAL | Status: DC
  Filled 2012-12-17: qty 1

## 2012-12-17 NOTE — Progress Notes (Signed)
Patient ID: [name], female   DOB: June 24, 1960, 52 y.o.   MRN: [id number] D: Pt. In bed reading, reports day been okay, c/o not sleeping well. Pt. Reports has been attending groups and that "they have been a great reminder of coping skills, sharing the toolbox and she has been able to share her experience." pt. Reports that she has been using since age 12 and was sober til 3 weeks ago. Currently denies SHI.  A: Writer introduced self to client and encouraged group. Pt. Will be monitored q55min for safety. R: Pt. Is safe on the unit. Pt. Did not attend group.

## 2012-12-17 NOTE — Progress Notes (Signed)
Adult Psychoeducational Group Note  Date:  12/17/2012 Time:  11:45 AM  Group Topic/Focus:  Wellness Toolbox:   The focus of this group is to discuss various aspects of wellness, balancing those aspects and exploring ways to increase the ability to experience wellness.  Patients will create a wellness toolbox for use upon discharge.  Participation Level:  Active  Participation Quality:  Appropriate, Attentive and Sharing  Affect:  Appropriate  Cognitive:  Appropriate  Insight: Appropriate  Engagement in Group:  Engaged  Modes of Intervention:  Discussion  Additional Comments:  Pt was very attentive and sharing while attending group. Pt shared that wellness was a persons state of mind. Pt talked about the last time she felt that she was well. " I had a job and I loved my job, I took care of my family" Pt stated. Pt plans to get rid of negative thoughts and to work on resume so she can get a job.   Sharyn Lull 12/17/2012, 11:45 AM

## 2012-12-17 NOTE — [facility] Group Notes (Signed)
[facility] LCSW Group Therapy  12/17/2012 2:46 PM  Type of Therapy:  Group Therapy  Participation Level:  Active  Participation Quality:  Attentive  Affect:  Appropriate  Cognitive:  Appropriate  Insight:  Engaged  Engagement in Therapy:  Engaged  Modes of Intervention:  Confrontation, Discussion, Education, Exploration, Socialization and Support  Summary of Progress/Problems: Today's Topic: Overcoming Obstacles. Pt identified obstacles faced currently and processed barriers involved in overcoming these obstacles. Pt identified steps necessary for overcoming these obstacles and explored motivation (internal and external) for facing these difficulties head on. Pt further identified one area of concern in their lives and chose a skill of focus pulled from their "toolbox." "[name]" [name] identified "easy access" to alcohol as her biggest anticipated obstacle upon discharge. [name] explored options for overcoming this obstacle with other group members and was able to process how this was an obstacle that she had control over. [name] suggested that she change her routine, hold herself accountable, and talk to family members about helping her through this difficult time where cravings may come out of nowhere. [name] processed how developing stronger coping skills--finding new hobbies, staying away from "bad influences," and seeking inpatient treatment to get help with mental health issues will help her overcome this obstacle.    [name], [name] 12/17/2012, 2:46 PM

## 2012-12-17 NOTE — Progress Notes (Signed)
Pt c/o lack of sleep. Her trazodone was increased last night to  100 mg which was still didn't help.  She stated,"I can fall asleep but I stay to sleep.". She rated all her depression hopelessness and anxiety a 10 on her self-inventory.  She still endorses S/I without a plan here in the hospital and contracts to come to staff.

## 2012-12-17 NOTE — Progress Notes (Signed)
Patient ID: [name], female   DOB: Jan 31, 1961, 52 y.o.   MRN: [id number] [facility] MD Progress Note  12/17/2012 2:21 PM [name]  MRN:  [id number]  Subjective: [name] continue to fantasize about committing suicide. However, she states that there is nothing here that she can use to hurt herself. Besides, she has always contemplating using a car and or gun to get it done. She adds that she does not like guns much because they make too much mess. She expressed that she has the good angel and the bad devil trying to tell her how to destroy herself, while the good angel tries to not condone the idea. She said that the devil's voice was trying to get her to do stupid stuff, but the voice of the angel tries to talk her out it by reminding her that all action has consequences. Patient presents with a nonchalant attitude about her symptoms, suicide attempt and the fact that she almost succeeded in drowning her few days ago. She remains in no apparent distress".  Diagnosis:   Axis I: Alcohol Abuse and Major Depression, Recurrent severe Axis II: Deferred Axis III:  Past Medical History  Diagnosis Date  . Depression   . Elevated testosterone level in female   . Pernicious anemia   . ADD (attention deficit disorder)   . ETOH abuse   . Lyme disease   . Sinus infection 02/10/2012    Pt reports this as a current problem  . Menopause 02/10/2012   Axis IV: economic problems, occupational problems, other psychosocial or environmental problems, problems related to legal system/crime, problems related to social environment and problems with primary support group Axis V: 41-50 serious symptoms  ADL's:  Intact  Sleep: Fair  Appetite:  Fair  Suicidal Ideation:  Plan:  None Intent:  None Means:  None Homicidal Ideation:  Denies  Psychiatric Specialty Exam: Review of Systems  Constitutional: Negative.   Eyes: Negative.   Respiratory: Negative.   Cardiovascular: Negative.    Gastrointestinal: Negative.   Genitourinary: Negative.   Musculoskeletal: Negative.   Skin: Negative.   Neurological: Positive for headaches.  Endo/Heme/Allergies: Negative.   Psychiatric/Behavioral: Positive for depression, suicidal ideas and substance abuse. The patient is nervous/anxious.     Blood pressure 100/71, pulse 86, temperature 98.4 F (36.9 C), temperature source Oral, resp. rate 16, height 5\' 6"  (1.676 m), weight 71.215 kg (157 lb).Body mass index is 25.35 kg/(m^2).  General Appearance: Casual  Eye Contact::  Fair  Speech:  Normal Rate, talkative  Volume:  Normal  Mood:  Depressed, anxious, at times, labile.  Affect:  Non-Congruent  Thought Process:  Coherent  Orientation:  Full (Time, Place, and Person)  Thought Content:  Auditory hallucinations  Suicidal Thoughts:  Yes.  without intent/plan  Homicidal Thoughts:  No  Memory:  Immediate;   Fair Recent;   Fair Remote;   Fair  Judgement:  Fair  Insight:  Fair  Psychomotor Activity:  Increased  Concentration:  Fair  Recall:  Fair  Akathisia:  No  Handed:  Right  AIMS (if indicated):     Assets:  Communication Skills Resilience Social Support  Sleep:  Number of Hours: 6.25   Current Medications: Current Facility-Administered Medications  Medication Dose Route Frequency Provider Last Rate Last Dose  . acetaminophen (TYLENOL) tablet 650 mg  650 mg Oral Q6H PRN Court Joy, PA-C      . alum & mag hydroxide-simeth (MAALOX/MYLANTA) 200-200-20 MG/5ML suspension 30 mL  30 mL Oral Q4H  PRN Court Joy, PA-C      . buPROPion (WELLBUTRIN XL) 24 hr tablet 300 mg  300 mg Oral Daily Court Joy, PA-C   300 mg at 12/17/12 0755  . chlordiazePOXIDE (LIBRIUM) capsule 25 mg  25 mg Oral Q6H PRN Court Joy, PA-C      . chlordiazePOXIDE (LIBRIUM) capsule 25 mg  25 mg Oral Once Court Joy, PA-C      . chlordiazePOXIDE (LIBRIUM) capsule 25 mg  25 mg Oral BH-qamhs Court Joy, PA-C   25 mg at 12/17/12 0754    Followed by  . [START ON 12/18/2012] chlordiazePOXIDE (LIBRIUM) capsule 25 mg  25 mg Oral Daily Court Joy, PA-C      . cholecalciferol (VITAMIN D) tablet 1,000 Units  1,000 Units Oral Daily Court Joy, PA-C   1,000 Units at 12/17/12 0753  . citalopram (CELEXA) tablet 40 mg  40 mg Oral Daily Court Joy, PA-C   40 mg at 12/17/12 1610  . hydrOXYzine (ATARAX/VISTARIL) tablet 25 mg  25 mg Oral Q6H PRN Court Joy, PA-C      . ibuprofen (ADVIL,MOTRIN) tablet 600 mg  600 mg Oral Q8H PRN Court Joy, PA-C   600 mg at 12/16/12 1519  . loperamide (IMODIUM) capsule 2-4 mg  2-4 mg Oral PRN Court Joy, PA-C      . loratadine (CLARITIN) tablet 10 mg  10 mg Oral Daily Court Joy, PA-C   10 mg at 12/17/12 0753  . magnesium hydroxide (MILK OF MAGNESIA) suspension 30 mL  30 mL Oral Daily PRN Court Joy, PA-C      . multivitamin with minerals tablet 1 tablet  1 tablet Oral Daily Court Joy, PA-C   1 tablet at 12/17/12 9604  . nicotine (NICODERM CQ - dosed in mg/24 hours) patch 21 mg  21 mg Transdermal Daily Court Joy, PA-C   21 mg at 12/17/12 0755  . ondansetron (ZOFRAN) tablet 4 mg  4 mg Oral Q8H PRN Court Joy, PA-C      . ondansetron (ZOFRAN-ODT) disintegrating tablet 4 mg  4 mg Oral Q6H PRN Court Joy, PA-C      . thiamine (B-1) injection 100 mg  100 mg Intramuscular Once Court Joy, PA-C      . thiamine (VITAMIN B-1) tablet 100 mg  100 mg Oral Daily Court Joy, PA-C   100 mg at 12/17/12 5409  . traZODone (DESYREL) tablet 100 mg  100 mg Oral QHS Nanine Means, NP        Lab Results: No results found for this or any previous visit (from the past 48 hour(s)).  Physical Findings: AIMS: Facial and Oral Movements Muscles of Facial Expression: None, normal Lips and Perioral Area: None, normal Jaw: None, normal Tongue: None, normal,Extremity Movements Upper (arms, wrists, hands, fingers): None, normal Lower (legs, knees, ankles, toes): None,  normal, Trunk Movements Neck, shoulders, hips: None, normal, Overall Severity Severity of abnormal movements (highest score from questions above): None, normal Incapacitation due to abnormal movements: None, normal Patient's awareness of abnormal movements (rate only patient's report): No Awareness, Dental Status Current problems with teeth and/or dentures?: No Does patient usually wear dentures?: No  CIWA:  CIWA-Ar Total: 0 COWS:     Treatment Plan Summary: Daily contact with patient to assess and evaluate symptoms and progress in treatment Medication management  Plan: Supportive approach/coping skills/relapse prevention. Initiate Seroquel 100 mg Q bedtime  for mood control. Encouraged out of room, participation in group sessions and application of coping skills when distressed. Will continue to monitor response to/adverse effects of medications in use to assure effectiveness. Continue to monitor mood, behavior and interaction with staff and other patients. Continue current plan of care.  Medical Decision Making Problem Points:  Established problem, stable/improving (1) and Review of psycho-social stressors (1) Data Points:  Review of medication regiment & side effects (2)  I certify that inpatient services furnished can reasonably be expected to improve the patient's condition.   Armandina Stammer I, PMH-NP 12/17/2012, 2:21 PM

## 2012-12-17 NOTE — Progress Notes (Signed)
Patient did not attend the evening speaker AA meeting. Pt remained in bed during meeting and reported a headache that she had just been medicated for.

## 2012-12-17 NOTE — Tx Team (Signed)
Interdisciplinary Treatment Plan Update (Adult)  Date: 12/17/2012  Time Reviewed: 11:44 AM   Progress in Treatment:  Attending groups: Yes Participating in groups:  Yes--monopolizing and not easily redirectable.  Taking medication as prescribed: Yes  Tolerating medication: Yes  Family/Significant othe contact made: Not yet. SPE required for this pt.  Patient understands diagnosis: Yes, AEB seeking treatment for SI, racing thoughts, mood stabilization/depression, and ETOH detox.  Discussing patient identified problems/goals with staff: Yes  Medical problems stabilized or resolved: Yes  Denies suicidal/homicidal ideation: Yes  Patient has not harmed self or Others: Yes  New problem(s) identified: pt reports racing/intrusive thoughts-possible AH.  Discharge Plan or Barriers: Pt interested in i/p program for mental illness as primary focus/substance abuse as secondary. Pt has BCBS OOS. CSW will explore aftercare options with pt.  Additional comments: [name] is an 52 y.o. female who presents to the ED with SI and alcohol abuse. Patient reports SI with plan to run her car into something. Pt states, " I shouldn't be driving." Pt states she had a plan to go to the store and not come home. Pt reports having access to her mother's car that she is borrowing. Patient [place] and then realizing she was awake and still alive, and got out of the car. Pt reports court date on 7/25/2014Pt reports she is making poor decisions but they don't seem real or to matter. Pt denies HI/AH/VH.  Pt reprots poor spending habits, and forgetting to pay pt son college tuition. Patient reports spending money on things she doesn't need. Pt reports she cashed out her 401K. Pt reports trouble staying asleep and racing thoughts. Pt reports decreased concentration. Pt reports symptoms of depression including, despondent, insomnia, isolating, anhedonia, and feelings of worthlessness. Patient  reports decreased appetite.  Pt reports she is currently moving from [place] to [place], going through a divorce, and has been unemployeed for 3 years. Reason for Continuation of Hospitalization: Passive SI Detox-librium taper Mood stabilization  Medication management Estimated length of stay: 3-5 days  For review of initial/current patient goals, please see plan of care.  Attendees:  Patient:    Family:    Physician: Geoffery Lyons MD 12/17/2012 11:46 AM   Nursing: Maureen Ralphs RN  12/17/2012. Now   Clinical Social Worker [place], [place]  12/17/2012 11:46 AM   Other: Lupita Leash RN 12/17/2012 11:46 AM   Other:    Other:   Other:    Scribe for Treatment Team:  Trula Slade LCSWA 12/17/2012 11:46 AM

## 2012-12-17 NOTE — [facility] Group Notes (Signed)
[facility] LCSW Aftercare Discharge Planning Group Note   12/17/2012 9:32 AM  Participation Quality:  Monopolizing  Mood/Affect:  Blunted  Depression Rating:  10  Anxiety Rating:  10  Thoughts of Suicide:  Yes Will you contract for safety?   Yes  Current AVH:  Yes Pt talked about her angel and devil that invade her mind and fill her head with thoughts about killing herself/being impulsive/taking dangerous risks, etc. Pt states that she is not able to get control over these intrusive thoughts and feels unsafe when not in hospital.   Plan for Discharge/Comments:  Pt interested in i/p program focusing on MH issues primarily. She is not interested in a SA i/p program. She lives with her sister in [place]. Pt has BCBS. She had recent DUI and courtdate in late July. Pt in need of letter. She states that she cannot cry and feels numb, yet reports depression and anxiety as 10. Pt was monopolizing and difficult to redirect while in group-racing thoughts reported are congruent with pt's observable behavior.   Transportation Means: sister  Supports: family members and friends from [place].   Smart, [employer]

## 2012-12-18 DIAGNOSIS — F329 Major depressive disorder, single episode, unspecified: Secondary | ICD-10-CM

## 2012-12-18 DIAGNOSIS — F319 Bipolar disorder, unspecified: Secondary | ICD-10-CM

## 2012-12-18 DIAGNOSIS — F29 Unspecified psychosis not due to a substance or known physiological condition: Secondary | ICD-10-CM

## 2012-12-18 MED ORDER — QUETIAPINE FUMARATE 50 MG PO TABS
50.0000 mg | ORAL_TABLET | Freq: Two times a day (BID) | ORAL | Status: DC
  Administered 2012-12-18 – 2012-12-21 (×6): 50 mg via ORAL
  Filled 2012-12-18 (×6): qty 1
  Filled 2012-12-18: qty 12
  Filled 2012-12-18: qty 1
  Filled 2012-12-18: qty 12
  Filled 2012-12-18 (×2): qty 1

## 2012-12-18 MED ORDER — QUETIAPINE FUMARATE 50 MG PO TABS
50.0000 mg | ORAL_TABLET | Freq: Every evening | ORAL | Status: DC | PRN
  Administered 2012-12-19: 50 mg via ORAL
  Filled 2012-12-18: qty 1

## 2012-12-18 NOTE — Progress Notes (Signed)
Recreation Therapy Notes  Date: 07.01.2014        Time: 3:00pm Location: [address] Dayroom      Group Topic/Focus: Self Expression  Participation Level: Did not attend  [employer] Timesha Cervantez, LRT/CTRS  Lucila Klecka L 12/18/2012 4:23 PM 

## 2012-12-18 NOTE — [facility] Group Notes (Signed)
[facility] LCSW Group Therapy  12/18/2012 2:35 PM  Type of Therapy:  Group Therapy  Participation Level:  Did Not Attend Smart, [name] 12/18/2012, 2:35 PM

## 2012-12-18 NOTE — Progress Notes (Addendum)
Recreation Therapy Notes  Date: 07.01.2014 Time: 2:30am Location: [address] Day[address]        Group Topic/Focus: Musician (AAA/T)  Participation Level: Did not attend  [employer], LRT/CTRS  Jearl Klinefelter 12/18/2012 4:46 PM

## 2012-12-18 NOTE — Progress Notes (Signed)
[facility] MD Progress Note  12/18/2012 3:49 PM [name]  MRN:  [id number] Subjective:  Still endorses thoughts, "voices": talking among each other. She is being told things to do like take running not to scape but to see what would happen. States that her rational mind gets in the way and allows her to refrain from doing it. She has had episodes in which she does not resist the impulse to do what the voices tell her to do. She is frightened by the fact that this time around she acted on what the vice told her [place]. She admits that she was drinking and that this most probably facilitated her acting on it. She admits to racing thoughts and that she was given Risperdal in the past what help them. She had some side effects so she would rather try something different. She also admits to a lot of anxiety and insomnia. She would like to try the Seroquel as we had discussed earlier. Diagnosis:  Bipolar Disorder vs. Major Depression with psychotic symptoms, Alcohol Abuse  ADL's:  Intact  Sleep: Poor  Appetite:  Fair  Suicidal Ideation:  Plan:  denies Intent:  denies Santos:  denies Homicidal Ideation:  Plan:  denies Intent:  denies Santos:  denies AEB (as evidenced by):  Psychiatric Specialty Exam: Review of Systems  Constitutional: Negative.   HENT: Negative.   Eyes: Negative.   Respiratory: Negative.   Cardiovascular: Negative.   Gastrointestinal: Negative.   Genitourinary: Negative.   Musculoskeletal: Negative.   Skin: Negative.   Neurological: Negative.   Endo/Heme/Allergies: Negative.   Psychiatric/Behavioral: Positive for hallucinations and substance abuse. The patient is nervous/anxious and has insomnia.     Blood pressure 90/62, pulse 86, temperature 98 F (36.7 C), temperature source Oral, resp. rate 18, height 5\' 6"  (1.676 m), weight 71.215 kg (157 lb).Body mass index is 25.35 kg/(m^2).  General Appearance: Fairly Groomed  Patent attorney::  Fair   Speech:  Clear and Coherent  Volume:  Normal  Mood:  Anxious, Depressed and worried  Affect:  worried  Thought Process:  Coherent and Goal Directed  Orientation:  Full (Time, Place, and Person)  Thought Content:  Hallucinations: Auditory and Rumination  Suicidal Thoughts:  Fleeting  Homicidal Thoughts:  No  Memory:  Immediate;   Fair Recent;   Fair Remote;   Fair  Judgement:  Fair  Insight:  Present  Psychomotor Activity:  Restlessness  Concentration:  Fair  Recall:  Fair  Akathisia:  No  Handed:  Right  AIMS (if indicated):     Assets:  Desire for Improvement Social Support  Sleep:  Number of Hours: 6.75   Current Medications: Current Facility-Administered Medications  Medication Dose Route Frequency Provider Last Rate Last Dose  . acetaminophen (TYLENOL) tablet 650 mg  650 mg Oral Q6H PRN Jessica Joy, PA-C      . alum & mag hydroxide-simeth (MAALOX/MYLANTA) 200-200-20 MG/5ML suspension 30 mL  30 mL Oral Q4H PRN Jessica Joy, PA-C      . buPROPion (WELLBUTRIN XL) 24 hr tablet 300 mg  300 mg Oral Daily Jessica Joy, PA-C   300 mg at 12/18/12 0804  . chlordiazePOXIDE (LIBRIUM) capsule 25 mg  25 mg Oral Once Jessica Joy, PA-C      . cholecalciferol (VITAMIN D) tablet 1,000 Units  1,000 Units Oral Daily Jessica Joy, PA-C   1,000 Units at 12/18/12 0805  . citalopram (CELEXA) tablet 40 mg  40 mg Oral Daily Jessica Joy, PA-C   40 mg at 12/18/12 6578  . ibuprofen (ADVIL,MOTRIN) tablet 600 mg  600 mg Oral Q8H PRN Jessica Joy, PA-C   600 mg at 12/16/12 1519  . loratadine (CLARITIN) tablet 10 mg  10 mg Oral Daily Jessica Joy, PA-C   10 mg at 12/18/12 0805  . magnesium hydroxide (MILK OF MAGNESIA) suspension 30 mL  30 mL Oral Daily PRN Jessica Joy, PA-C      . multivitamin with minerals tablet 1 tablet  1 tablet Oral Daily Jessica Joy, PA-C   1 tablet at 12/18/12 0804  . nicotine (NICODERM CQ - dosed in mg/24 hours) patch 21 mg  21 mg Transdermal  Daily Jessica Joy, PA-C   21 mg at 12/18/12 4696  . ondansetron (ZOFRAN) tablet 4 mg  4 mg Oral Q8H PRN Jessica Joy, PA-C      . QUEtiapine (SEROQUEL) tablet 50 mg  50 mg Oral BID Jessica Fee, MD   50 mg at 12/18/12 1046  . thiamine (B-1) injection 100 mg  100 mg Intramuscular Once Jessica Joy, PA-C      . thiamine (VITAMIN B-1) tablet 100 mg  100 mg Oral Daily Jessica Joy, PA-C   100 mg at 12/18/12 0804  . traZODone (DESYREL) tablet 100 mg  100 mg Oral QHS Jessica Means, NP   100 mg at 12/17/12 2125    Lab Results: No results found for this or any previous visit (from the past 48 hour(s)).  Physical Findings: AIMS: Facial and Oral Movements Muscles of Facial Expression: None, normal Lips and Perioral Area: None, normal Jaw: None, normal Tongue: None, normal,Extremity Movements Upper (arms, wrists, hands, fingers): None, normal Lower (legs, knees, ankles, toes): None, normal, Trunk Movements Neck, shoulders, hips: None, normal, Overall Severity Severity of abnormal movements (highest score from questions above): None, normal Incapacitation due to abnormal movements: None, normal Patient's awareness of abnormal movements (rate only patient's report): No Awareness, Dental Status Current problems with teeth and/or dentures?: No Does patient usually wear dentures?: No  CIWA:  CIWA-Ar Total: 0 COWS:     Treatment Plan Summary: Daily contact with patient to assess and evaluate symptoms and progress in treatment Medication management  Plan: Supportive approach/coping skills/relapse prevention            pursue the Librium Detox protocol           Trial with Seroquel 50 mg BID            Reassess the diagnosis  Medical Decision Making Problem Points:  Review of psycho-social stressors (1) Data Points:  Review of medication regiment & side effects (2) Review of new medications or change in dosage (2)  I certify that inpatient services furnished can reasonably be  expected to improve the patient's condition.   [name] A 12/18/2012, 3:49 PM

## 2012-12-18 NOTE — Progress Notes (Signed)
Adult Psychoeducational Group Note  Date:  12/18/2012 Time:  11:57 AM  Group Topic/Focus:  Recovery Goals:   The focus of this group is to identify appropriate goals for recovery and establish a plan to achieve them.  Participation Level:  Active  Participation Quality:  Appropriate, Attentive and Sharing  Affect:  Appropriate  Cognitive:  Alert and Appropriate  Insight: Appropriate  Engagement in Group:  Engaged  Modes of Intervention:  Discussion  Additional Comments:  Pt was appropriate and sharing while attending group. Pt stated that her mental illness and hearing negative thoughts is what is standing between her and recovery. Pt has accepted that she is a alcoholic and knows that this does not help her illness.   Sharyn Lull 12/18/2012, 11:57 AM

## 2012-12-18 NOTE — [facility] Suicide Risk Assessment (Signed)
[facility] INPATIENT:  Family/Significant Other Suicide Prevention Education  Suicide Prevention Education:  Education Completed; Burna Mortimer (sister) has been identified by the patient as the family member/significant other with whom the patient will be residing, and identified as the person(s) who will aid the patient in the event of a mental health crisis (suicidal ideations/suicide attempt).  With written consent from the patient, the family member/significant other has been provided the following suicide prevention education, prior to the and/or following the discharge of the patient.  The suicide prevention education provided includes the following:  Suicide risk factors  Suicide prevention and interventions  National Suicide Hotline telephone number  [facility] assessment telephone number  [facility] Emergency Assistance 911  [facility] and/or Residential Mobile Crisis Unit telephone number  Request made of family/significant other to:  Remove weapons (e.g., guns, rifles, knives), all items previously/currently identified as safety concern.    Remove drugs/medications (over-the-counter, prescriptions, illicit drugs), all items previously/currently identified as a safety concern.  The family member/significant other verbalizes understanding of the suicide prevention education information provided.  The family member/significant other agrees to remove the items of safety concern listed above.  Smart, [name] 12/18/2012, 10:10 AM

## 2012-12-18 NOTE — Progress Notes (Signed)
D:  Patient up and present in the milieu most of the morning.  Her blood pressure has been running a bit low today and she has complained of feeling lightheaded.  She has been taking in plenty of fluids.  After taking her first dose of quetiapine 50 mg she asked to lie down.  She did not feel that she would be able to make it to the cafeteria for lunch.  She admits to having some suicidal thoughts, but states she would not act on them at this time.  She rates depression and hopelessness at 10.  She states that she has thoughts in her head like she has an angel and a devil arguing on opposite sides of her head and she does not always know which one to listen to.  States they are not really voices, but does admit to having racing and irrational thoughts.   A:  Medications given as prescribed.  Quetiapine started today.  She was allowed to stay back from lunch and a meal tray was ordered for her.  She was given a pitcher of Gatorade and encouraged drink as much as possible throughout the day.  We have continued to monitor her blood pressure as well both automatic and manually.   R:  She has been pleasant and cooperative today.  Attending and fully participating in groups.  She has interacted well with her peers.  She remains dizzy/lightheaded, probably more so since starting the quetiapine.  Patient is safe at this time and we will continue to monitor her closely.

## 2012-12-18 NOTE — [facility] Group Notes (Signed)
[facility] LCSW Aftercare Discharge Planning Group Note   12/18/2012 9:46 AM  Participation Quality:  Appropriate   Mood/Affect:  Appropriate  Depression Rating:  10  Anxiety Rating:  10  Thoughts of Suicide:  No Will you contract for safety?   NA  Current AVH:  No  Plan for Discharge/Comments:  Pt interested in [facility] [place]. CSW called this morning to check status. Pt accepted after paying deduct able. Was told representative would call me back shortly. Pt focused on being told that she has symptoms of psychosis by doctor/PA. Pt asked CSW to call her sister. SPE completed with her sister and talked to her about program Luster Landsberg had been accepted into. Pt states that doctor would like to keep her a few more days in order to stabilize on meds.   Transportation Means: sister  Supports: Mudlogger, Research scientist (physical sciences)

## 2012-12-19 DIAGNOSIS — F323 Major depressive disorder, single episode, severe with psychotic features: Secondary | ICD-10-CM

## 2012-12-19 NOTE — [facility] Group Notes (Signed)
[facility] LCSW Aftercare Discharge Planning Group Note   12/19/2012 9:38 AM  Participation Quality:  Appropriate  Mood/Affect:  Appropriate  Depression Rating:  8  Anxiety Rating:  8  Thoughts of Suicide:  No Will you contract for safety?   NA  Current AVH:  No  Plan for Discharge/Comments:  Pt has phone assessment today with [facility]. She is open to this facility once her mood stabilizes. Pt still experiencing racing thoughts and intrusive thoughts.   Transportation Means: sister   Supports: Mudlogger, Research scientist (physical sciences)

## 2012-12-19 NOTE — Progress Notes (Signed)
Pt reported her sleep as fair but stated,"I slept during the day yesterday" She rated both her depression and hopelessness a 10 and denies any anxiety on her self-inventory.  She does admit to some passive S/I with no plan here in hospital and contracts to come to staff.  She wrote "I need to be in a safe place because the evil side might take over".  This was in response to what do you plan to change when you go home to take better care of yourself.

## 2012-12-19 NOTE — Progress Notes (Signed)
D: Patient denies SI/HI/AVH. Patient rates hopelessness as 10,  depression as 10, and anxiety as 10.  Patient affect is flat. Mood is depressed and anxious.  Pt states, "I just moved here.  I tried to commit suicide last week.  I have a bad person and a good person that keeps talking to me.  I already talked to the doctor about it."  Patient NOT visible on the milieu. Pt slept the entire shift.  Night time Seroquel and Trazodone were held."  No distress noted. A: Support and encouragement offered.  Q 15 min checks continued for patient safety. R: Patient receptive. Patient remains safe on the unit.

## 2012-12-19 NOTE — Progress Notes (Signed)
Adult Psychoeducational Group Note  Date:  12/19/2012 Time:  11:00 AM Group Topic/Focus:  Personal Choices and Values:   The focus of this group is to help patients assess and explore the importance of values in their lives, how their values affect their decisions, how they express their values and what opposes their expression.  Participation Level:  Active  Participation Quality:  Sharing and Supportive  Affect:  Appropriate  Cognitive:  Appropriate  Insight: Good  Engagement in Group:  Engaged and Supportive  Modes of Intervention:  Discussion and Support  Additional Comments:  Pt was very active during group. Pt was supportive of others that were sharing their feelings and experiences, and offered insight from her own experiences. Pt shared goals of rebuilding her family and social values for her own personal development.  Malachy Moan 12/19/2012, 1:06 PM

## 2012-12-19 NOTE — Progress Notes (Signed)
Adult Psychoeducational Group Note  Date:  12/19/2012 Time:  9:58 PM  Group Topic/Focus:  Goals Group:   The focus of this group is to help patients establish daily goals to achieve during treatment and discuss how the patient can incorporate goal setting into their daily lives to aide in recovery.  Participation Level:  None  Participation Quality:  didnt attend  Affect:  didnt attend  Cognitive:  didnt attend  Insight: None  Engagement in Group:  didnt attend  Modes of Intervention:  didnt attend  Additional Comments:  Pt stayed in the bed.  Terie Purser R 12/19/2012, 9:58 PM

## 2012-12-19 NOTE — ED Provider Notes (Signed)
Medical screening examination/treatment/procedure(s) were performed by non-physician practitioner and as supervising physician I was immediately available for consultation/collaboration.  Flint Melter, MD 12/19/12 1104

## 2012-12-19 NOTE — [facility] Group Notes (Signed)
[facility] LCSW Group Therapy  12/19/2012 2:19 PM  Type of Therapy:  Group Therapy  Participation Level:  Active  Participation Quality:  Appropriate  Affect:  Appropriate  Cognitive:  Appropriate  Insight:  Engaged  Engagement in Therapy:  Engaged  Modes of Intervention:  Discussion, Education, Exploration, Socialization and Support  Summary of Progress/Problems: Emotion Regulation: This group focused on both positive and negative emotion identification and allowed group members to process ways to identify feelings, regulate negative emotions, and find healthy ways to manage internal/external emotions. Group members were asked to reflect on a time when their reaction to an emotion led to a negative outcome and explored how alternative responses using emotion regulation would have benefited them. Group members were also asked to discuss a time when emotion regulation was utilized when a negative emotion was experienced.  Farris stated that guilt and shame are two emotions that she struggles with. She stated that she has difficulty controlling her intrusive thoughts and described shame as "nervous/shaky/shallow breathy/panicky/low to the ground" Kagan was able to process how developing healthy coping skills like communication and "not being afraid to ask for help" are vital for her ability to effectively regulate emotions.    Smart, Jaxsun Ciampi 12/19/2012, 2:19 PM

## 2012-12-19 NOTE — Progress Notes (Signed)
Patient ID: [name], female   DOB: 1960/09/10, 52 y.o.   MRN: [id number] [facility] MD Progress Note  12/19/2012 12:14 PM [name]  MRN:  [id number]  Subjective: [name] continue to endorse some good/bad voices telling her what to do. One of the voices commands her on how to kill herself, the good voice interjects and tells her what the consequences might be. She continues to ask to clarify psychosis for her. She reports that she was actually feeling agitated earlier today. She thinks getting agitated is a good thing because she can actually feel it, unlike the other days where she feels numb and unaffected by anything. She adds that she believed that she is actually regaining her human touch and feelings. She hopes that the Seroquel will not only help her sleep, but will help control her psychosis. She reports that the bad voice seem stronger than the good voice, and for that reason, she does not trust herself or her safety. She says that it was the bad voice that is telling her that she is not going to get any better. [name] states that she is looking forward to going to [facility] for further treatment after discharge. Would not want to go home with her sister after discharge because she does not want to do something stupid in her house. After all, she can always find the car keys to drive off and kill herself".  Diagnosis:  Bipolar Disorder vs. Major Depression with psychotic symptoms, Alcohol Abuse  ADL's:  Intact  Sleep: Poor  Appetite:  Fair  Suicidal Ideation:  Plan:  denies Intent:  denies Means:  denies Homicidal Ideation:  Plan:  denies Intent:  denies Means:  denies AEB (as evidenced by):  Psychiatric Specialty Exam: Review of Systems  Constitutional: Negative.   HENT: Negative.   Eyes: Negative.   Respiratory: Negative.   Cardiovascular: Negative.   Gastrointestinal: Negative.   Genitourinary: Negative.   Musculoskeletal: Negative.   Skin:  Negative.   Neurological: Negative.   Endo/Heme/Allergies: Negative.   Psychiatric/Behavioral: Positive for hallucinations and substance abuse. The patient is nervous/anxious and has insomnia.     Blood pressure 104/69, pulse 88, temperature 98.1 F (36.7 C), temperature source Oral, resp. rate 16, height 5\' 6"  (1.676 m), weight 71.215 kg (157 lb).Body mass index is 25.35 kg/(m^2).  General Appearance: Fairly Groomed  Patent attorney::  Fair  Speech:  Clear and Coherent  Volume:  Normal  Mood:  Anxious, Depressed and worried  Affect:  worried  Thought Process:  Coherent and Goal Directed  Orientation:  Full (Time, Place, and Person)  Thought Content:  Hallucinations: Auditory and Rumination  Suicidal Thoughts:  Fleeting  Homicidal Thoughts:  No  Memory:  Immediate;   Fair Recent;   Fair Remote;   Fair  Judgement:  Fair  Insight:  Present  Psychomotor Activity:  Restlessness  Concentration:  Fair  Recall:  Fair  Akathisia:  No  Handed:  Right  AIMS (if indicated):     Assets:  Desire for Improvement Social Support  Sleep:  Number of Hours: 5.75   Current Medications: Current Facility-Administered Medications  Medication Dose Route Frequency Provider Last Rate Last Dose  . acetaminophen (TYLENOL) tablet 650 mg  650 mg Oral Q6H PRN Court Joy, PA-C      . alum & mag hydroxide-simeth (MAALOX/MYLANTA) 200-200-20 MG/5ML suspension 30 mL  30 mL Oral Q4H PRN Court Joy, PA-C      . buPROPion (WELLBUTRIN XL) 24 hr  tablet 300 mg  300 mg Oral Daily Court Joy, PA-C   300 mg at 12/19/12 0801  . chlordiazePOXIDE (LIBRIUM) capsule 25 mg  25 mg Oral Once Court Joy, PA-C      . cholecalciferol (VITAMIN D) tablet 1,000 Units  1,000 Units Oral Daily Court Joy, PA-C   1,000 Units at 12/19/12 0801  . citalopram (CELEXA) tablet 40 mg  40 mg Oral Daily Court Joy, PA-C   40 mg at 12/19/12 0801  . ibuprofen (ADVIL,MOTRIN) tablet 600 mg  600 mg Oral Q8H PRN Court Joy, PA-C   600 mg at 12/16/12 1519  . loratadine (CLARITIN) tablet 10 mg  10 mg Oral Daily Court Joy, PA-C   10 mg at 12/19/12 0801  . magnesium hydroxide (MILK OF MAGNESIA) suspension 30 mL  30 mL Oral Daily PRN Court Joy, PA-C      . multivitamin with minerals tablet 1 tablet  1 tablet Oral Daily Court Joy, PA-C   1 tablet at 12/19/12 0801  . nicotine (NICODERM CQ - dosed in mg/24 hours) patch 21 mg  21 mg Transdermal Daily Court Joy, PA-C   21 mg at 12/19/12 0800  . ondansetron (ZOFRAN) tablet 4 mg  4 mg Oral Q8H PRN Court Joy, PA-C      . QUEtiapine (SEROQUEL) tablet 50 mg  50 mg Oral BID Rachael Fee, MD   50 mg at 12/19/12 0801  . QUEtiapine (SEROQUEL) tablet 50 mg  50 mg Oral QHS PRN Rachael Fee, MD      . thiamine (B-1) injection 100 mg  100 mg Intramuscular Once Court Joy, PA-C      . thiamine (VITAMIN B-1) tablet 100 mg  100 mg Oral Daily Court Joy, PA-C   100 mg at 12/19/12 0801  . traZODone (DESYREL) tablet 100 mg  100 mg Oral QHS Nanine Means, NP   100 mg at 12/17/12 2125    Lab Results: No results found for this or any previous visit (from the past 48 hour(s)).  Physical Findings: AIMS: Facial and Oral Movements Muscles of Facial Expression: None, normal Lips and Perioral Area: None, normal Jaw: None, normal Tongue: None, normal,Extremity Movements Upper (arms, wrists, hands, fingers): None, normal Lower (legs, knees, ankles, toes): None, normal, Trunk Movements Neck, shoulders, hips: None, normal, Overall Severity Severity of abnormal movements (highest score from questions above): None, normal Incapacitation due to abnormal movements: None, normal Patient's awareness of abnormal movements (rate only patient's report): No Awareness, Dental Status Current problems with teeth and/or dentures?: No Does patient usually wear dentures?: No  CIWA:  CIWA-Ar Total: 0 COWS:     Treatment Plan Summary: Daily contact with patient to  assess and evaluate symptoms and progress in treatment Medication management  Plan: Supportive approach/coping skills/relapse prevention Continue the Librium Detox protocol to combat alcohol withdrawal symptoms. Continue Seroquel 50 mg BID Reassess the diagnosis. Discharge plans in progress. Continue current plan of care.  Medical Decision Making Problem Points:  Review of psycho-social stressors (1) Data Points:  Review of medication regiment & side effects (2) Review of new medications or change in dosage (2)  I certify that inpatient services furnished can reasonably be expected to improve the patient's condition.   Sanjuana Kava, FNP, PMHNP 12/19/2012, 12:14 PM

## 2012-12-20 DIAGNOSIS — F1020 Alcohol dependence, uncomplicated: Principal | ICD-10-CM

## 2012-12-20 MED ORDER — QUETIAPINE FUMARATE 100 MG PO TABS
100.0000 mg | ORAL_TABLET | Freq: Every day | ORAL | Status: DC
  Filled 2012-12-20 (×4): qty 1

## 2012-12-20 NOTE — [facility] Group Notes (Signed)
[facility] LCSW Aftercare Discharge Planning Group Note   12/20/2012 9:44 AM  Participation Quality:  Appropriate   Mood/Affect:  Blunted  Depression Rating:  10  Anxiety Rating:  10  Thoughts of Suicide:  No Will you contract for safety?   NA  Current AVH:  Yes  Plan for Discharge/Comments:  Pt reports that she still hears "bad voice in my head" telling her to do bad things but that she is able to control these thoughts. Pt referral sent to [facility] and she completed phone interview yesterday. CSW to call [facility] at 10am to check for update. Pt stated her sister also found a "place in [place] that may take her." CSW to contact sister after getting update in order to discuss aftercare further.   Transportation Means: sister  Supports: Mudlogger, Research scientist (physical sciences)

## 2012-12-20 NOTE — Progress Notes (Signed)
D:  Patient up and active in the milieu today.  Her grooming and hygiene are improved.  She rates her depression and hopelessness at 10 today, but affect is brighter and she is interacting more with peers today.  States she will be going to [place], [place] to a dual diagnosis center for 28 days.  States she does not feel secure being outside of a facility of some sort at this time.  Notes she is still having she self harm thoughts, but agrees to seek out staff if feeling unsafe.   A:  Medications given as prescribed.  Encouraged participation in all groups.  Discussed importance of staying focused on both recovery and mental health treatment.   R:  Pleasant and cooperative.  Interacting well with staff and peers.  Affect a bit brighter today.  Progressing through detox.  Safety is maintained on the unit.

## 2012-12-20 NOTE — Progress Notes (Signed)
[facility] MD Progress Note  12/20/2012 12:45 PM [name]  MRN:  [id number] Subjective:  [name] endorses that she is still having the thoughts, "the voices" going on. She did not sleep too well last night. States that she wants to understand what is happening to her. Describes  episodes of increased energy impulsivity driven behavior out of character for her. Describes sexual indiscretions although states she is happily married. She is concerned about her acting out on these thoughts, voices. Describes epsiosdes of prolonged depression. States she will not feel safe outside an institution Diagnosis:  Alcohol Dependence, Bipolar Disorder  ADL's:  Intact  Sleep: Poor  Appetite:  Fair  Suicidal Ideation:  Plan:  denies Intent:  denies Means:  denies Homicidal Ideation:  Plan:  denies Intent:  denies Means:  denies AEB (as evidenced by):  Psychiatric Specialty Exam: Review of Systems  Constitutional: Negative.   HENT: Negative.   Eyes: Negative.   Respiratory: Negative.   Cardiovascular: Negative.   Gastrointestinal: Negative.   Genitourinary: Negative.   Musculoskeletal: Negative.   Skin: Negative.   Neurological: Negative.   Endo/Heme/Allergies: Negative.   Psychiatric/Behavioral: Positive for suicidal ideas and substance abuse. The patient is nervous/anxious.     Blood pressure 96/66, pulse 88, temperature 98.2 F (36.8 C), temperature source Oral, resp. rate 18, height 5\' 6"  (1.676 m), weight 71.215 kg (157 lb).Body mass index is 25.35 kg/(m^2).  General Appearance: Fairly Groomed  Patent attorney::  Fair  Speech:  Clear and Coherent  Volume:  Normal  Mood:  Anxious and worried  Affect:  worried  Thought Process:  Coherent and Goal Directed  Orientation:  Full (Time, Place, and Person)  Thought Content:  symptoms. worries, concerns  Suicidal Thoughts:  Yes.  without intent/plan  Homicidal Thoughts:  No  Memory:  Immediate;   Fair Recent;   Fair Remote;   Fair   Judgement:  Fair  Insight:  Present  Psychomotor Activity:  Restlessness  Concentration:  Fair  Recall:  Fair  Akathisia:  No  Handed:  Right  AIMS (if indicated):     Assets:  Desire for Improvement Social Support  Sleep:  Number of Hours: 6.75   Current Medications: Current Facility-Administered Medications  Medication Dose Route Frequency Provider Last Rate Last Dose  . acetaminophen (TYLENOL) tablet 650 mg  650 mg Oral Q6H PRN Court Joy, PA-C      . alum & mag hydroxide-simeth (MAALOX/MYLANTA) 200-200-20 MG/5ML suspension 30 mL  30 mL Oral Q4H PRN Court Joy, PA-C      . buPROPion (WELLBUTRIN XL) 24 hr tablet 300 mg  300 mg Oral Daily Court Joy, PA-C   300 mg at 12/20/12 4098  . chlordiazePOXIDE (LIBRIUM) capsule 25 mg  25 mg Oral Once Court Joy, PA-C      . cholecalciferol (VITAMIN D) tablet 1,000 Units  1,000 Units Oral Daily Court Joy, PA-C   1,000 Units at 12/20/12 0800  . citalopram (CELEXA) tablet 40 mg  40 mg Oral Daily Court Joy, PA-C   40 mg at 12/20/12 1191  . ibuprofen (ADVIL,MOTRIN) tablet 600 mg  600 mg Oral Q8H PRN Court Joy, PA-C   600 mg at 12/16/12 1519  . loratadine (CLARITIN) tablet 10 mg  10 mg Oral Daily Court Joy, PA-C   10 mg at 12/20/12 4782  . magnesium hydroxide (MILK OF MAGNESIA) suspension 30 mL  30 mL Oral Daily PRN Court Joy, PA-C      .  multivitamin with minerals tablet 1 tablet  1 tablet Oral Daily Court Joy, PA-C   1 tablet at 12/20/12 1610  . nicotine (NICODERM CQ - dosed in mg/24 hours) patch 21 mg  21 mg Transdermal Daily Court Joy, PA-C   21 mg at 12/20/12 9604  . ondansetron (ZOFRAN) tablet 4 mg  4 mg Oral Q8H PRN Court Joy, PA-C      . QUEtiapine (SEROQUEL) tablet 50 mg  50 mg Oral BID Rachael Fee, MD   50 mg at 12/20/12 5409  . QUEtiapine (SEROQUEL) tablet 50 mg  50 mg Oral QHS PRN Rachael Fee, MD   50 mg at 12/19/12 2139  . thiamine (B-1) injection 100 mg  100 mg  Intramuscular Once Court Joy, PA-C      . thiamine (VITAMIN B-1) tablet 100 mg  100 mg Oral Daily Court Joy, PA-C   100 mg at 12/20/12 8119  . traZODone (DESYREL) tablet 100 mg  100 mg Oral QHS Nanine Means, NP   100 mg at 12/19/12 2139    Lab Results: No results found for this or any previous visit (from the past 48 hour(s)).  Physical Findings: AIMS: Facial and Oral Movements Muscles of Facial Expression: None, normal Lips and Perioral Area: None, normal Jaw: None, normal Tongue: None, normal,Extremity Movements Upper (arms, wrists, hands, fingers): None, normal Lower (legs, knees, ankles, toes): None, normal, Trunk Movements Neck, shoulders, hips: None, normal, Overall Severity Severity of abnormal movements (highest score from questions above): None, normal Incapacitation due to abnormal movements: None, normal Patient's awareness of abnormal movements (rate only patient's report): No Awareness, Dental Status Current problems with teeth and/or dentures?: No Does patient usually wear dentures?: No  CIWA:  CIWA-Ar Total: 0 COWS:     Treatment Plan Summary: Daily contact with patient to assess and evaluate symptoms and progress in treatment Medication management  Plan: Supportive approach/coping skills/relapse prevention           Increase the Seroquel to 50 mg BID, 100 mg HS  Medical Decision Making Problem Points:  Review of psycho-social stressors (1) Data Points:  Review of medication regiment & side effects (2) Review of new medications or change in dosage (2)  I certify that inpatient services furnished can reasonably be expected to improve the patient's condition.   Polk Minor A 12/20/2012, 12:45 PM

## 2012-12-20 NOTE — Progress Notes (Signed)
Recreation Therapy Notes  Date: 07.02.2014 Time: 3:00pm Location: [address] Dayroom      Group Topic/Focus: Teamwork, Communication, Problem Solving  Participation Level: Active  Participation Quality: Appropriate  Affect: Euthymic  Cognitive: Appropriate   Additional Comments: Activity: Cup Selina Cooley ; Explanation: Patients were asked to build a pyramid using 10 plastic cups and ribbon tied to 2 rubber bands. Patients were instructed not to touch the cups with their hands. Patients completed activities in two rounds.   Patient participated in the first round of activity. Patient with peers successful at building a pyramid. Patient contributed to wrap up discussion about the importance of using problem solving, communication and team work skills post d/c. Patient shared with group she has lost some people in her support system due to her alcohol use. Patient stated problem solving skills used in group will be important post d/c to help her find new ways to deal with social situations. Patient shared social situations are the most difficult for her due to alcoholic beverages being readily available. Patient verbalized desire not to isolate herself to maintain her sobriety post d/c. Patient verbalized understanding of being able to use communication and her support system to help her through those times.   Marykay Lex Julee Stoll, LRT/CTRS  Maynard David L 12/20/2012 4:16 PM

## 2012-12-20 NOTE — Progress Notes (Signed)
D: Patient denies SI/HI/AVH. Patient rates hopelessness as 10,  depression as 10, and anxiety as 10.  Patient affect is flat.  Mood is depressed.  Pt states, "Nobody knows how depressed I am at home.  I don't talk about it."  Patient did NOT attend evening group. Patient visible on the milieu. No distress noted. A: Support and encouragement offered. Scheduled medications given to pt. Q 15 min checks continued for patient safety. R: Patient receptive. Patient remains safe on the unit.

## 2012-12-20 NOTE — Progress Notes (Signed)
Patient did not attend the evening karaoke group. Pt was notified that group was starting but  remained in bed.

## 2012-12-21 MED ORDER — QUETIAPINE FUMARATE 50 MG PO TABS: 50.0000 mg | ORAL_TABLET | Freq: Two times a day (BID) | ORAL | Status: DC

## 2012-12-21 MED ORDER — BUPROPION HCL ER (XL) 300 MG PO TB24: 300.0000 mg | ORAL_TABLET | Freq: Every day | ORAL | Status: DC

## 2012-12-21 MED ORDER — QUETIAPINE FUMARATE 50 MG PO TABS: ORAL_TABLET | ORAL | Status: DC

## 2012-12-21 MED ORDER — CETIRIZINE HCL 10 MG PO TABS
10.0000 mg | ORAL_TABLET | Freq: Every day | ORAL | Status: DC
  Filled 2012-12-21: qty 3

## 2012-12-21 MED ORDER — TRAZODONE HCL 100 MG PO TABS: 100.0000 mg | ORAL_TABLET | Freq: Every day | ORAL | Status: DC

## 2012-12-21 MED ORDER — VITAMIN D 1000 UNITS PO TABS: 1000.0000 [IU] | ORAL_TABLET | Freq: Every day | ORAL | Status: DC

## 2012-12-21 MED ORDER — CETIRIZINE HCL 10 MG PO TABS: 10.0000 mg | ORAL_TABLET | Freq: Every day | ORAL | Status: DC | PRN

## 2012-12-21 MED ORDER — DROSPIRENONE-ESTRADIOL 0.5-1 MG PO TABS: 1.0000 | ORAL_TABLET | Freq: Every day | ORAL | Status: DC

## 2012-12-21 MED ORDER — CITALOPRAM HYDROBROMIDE 40 MG PO TABS: 40.0000 mg | ORAL_TABLET | Freq: Every day | ORAL | Status: DC

## 2012-12-21 MED ORDER — KETOCONAZOLE 2 % EX SHAM: 1.0000 "application " | MEDICATED_SHAMPOO | CUTANEOUS | Status: DC

## 2012-12-21 NOTE — Progress Notes (Signed)
Nrsg DC MD completed DC AVS adn DC SRA. Pt  Given DC instuctions in AVS, stated she understands and will comply and her family is here to transport her to treatment center, where she will be admitted later today, for 90 day in patient treatment. Pt states she is rpesent for auditory hallucinations ; " voices" she says she hears that tell her to hurt herself, but she states she feels safe being DC'd today; knowing that she sill be in presence of family and that she will not drive/t is given prescriptions from MD as well as sample meds from pharmacy and stated she understands dosages. Pt contracts to stay safe, saying "that's why I'm going into treatment center, because I know I need help with the voices". All belongings are returned to pt and she is escorted to bldg entrance and DC'd to care of sister.

## 2012-12-21 NOTE — [facility] Suicide Risk Assessment (Signed)
Suicide Risk Assessment  Discharge Assessment     Demographic Factors:  Caucasian  Mental Status Per Nursing Assessment::   On Admission:  Suicidal ideation indicated by patient;Suicide plan;Plan includes specific time, place, or method;Intention to act on suicide plan;Belief that plan would result in death  Current Mental Status by Physician: In full contact with reality. There is fear about the possibility of hurting herself, has had suicidal rumination but no plans no intent. States she felt safe here and feels she is going to be safe and work furher on her mental health when she gets to [facility] [place] today   Loss Factors: Decrease in vocational status  Historical Factors: NA  Risk Reduction Factors:   Sense of responsibility to family, Living with another person, especially a relative and Positive social support  Continued Clinical Symptoms:  Bipolar Disorder:   Bipolar II Alcohol/Substance Abuse/Dependencies  Cognitive Features That Contribute To Risk:  Polarized thinking Thought constriction (tunnel vision)    Suicide Risk:  Mild:  Suicidal ideation of limited frequency, intensity, duration, and specificity.  There are no identifiable plans, no associated intent, mild dysphoria and related symptoms, good self-control (both objective and subjective assessment), few other risk factors, and identifiable protective factors, including available and accessible social support.  Discharge Diagnoses:   AXIS I:  Alcohol Dependence, Bipolar Disorder II AXIS II:  Deferred AXIS III:   Past Medical History  Diagnosis Date  . Depression   . Elevated testosterone level in female   . Pernicious anemia   . ADD (attention deficit disorder)   . ETOH abuse   . Lyme disease   . Sinus infection 02/10/2012    Pt reports this as a current problem  . Menopause 02/10/2012   AXIS IV:  other psychosocial or environmental problems AXIS V:  51-60 moderate symptoms  Plan Of  Care/Follow-up recommendations:  Activity:  as tolerated Diet:  regular Follow up [facility] [place] Is patient on multiple antipsychotic therapies at discharge:  No   Has Patient had three or more failed trials of antipsychotic monotherapy by history:  No  Recommended Plan for Multiple Antipsychotic Therapies: N/A   Clerance Umland A 12/21/2012, 8:45 AM

## 2012-12-21 NOTE — Progress Notes (Signed)
D   Pt spent the entire shift in bed sleeping   She tried to get up for karoke group but she said the medication was making her too sleepy  Her hs medications were held due to her being lethargic A   Verbal support given   Q 15 min checks  Monitor for medication effects R   Pt safe at present

## 2012-12-21 NOTE — Progress Notes (Signed)
[facility] Adult Case Management Discharge Plan :  Will you be returning to the same living situation after discharge: No. At discharge, do you have transportation home?:Yes,  sister Do you have the ability to pay for your medications:Yes,  BCBS  Release of information consent forms completed and in the chart;  Patient's signature needed at discharge.  Patient to Follow up at: Follow-up Information   Follow up with [facility]. (Admission today. Call after discharge and ask for Emma-verify what to brring and let her know what time you expect to arrive. )    Contact information:   [address] [place], [place] [address] phone: [phone] fax: [phone]      Patient denies SI/HI:   No.    Safety Planning and Suicide Prevention discussed:  Yes. SPE completed with pt's sister. SPI pamphlet given to pt and pt encouraged to ask questions and voice concerns.   Smart, Londa Mackowski 12/21/2012, 8:22 AM

## 2012-12-21 NOTE — Discharge Summary (Signed)
Physician Discharge Summary Note  Patient:  [name] is an 52 y.o., female MRN:  [id number] DOB:  [birth year] Patient phone:  [phone] (home)  Patient address:   [address] [place] [address],   Date of Admission:  12/15/2012  Date of Discharge: 12/21/12  Reason for Admission:  Suicide attempts  Discharge Diagnoses: Principal Problem:   Alcohol dependence Active Problems:   Major depressive disorder, recurrent episode, severe, without mention of psychotic behavior  Review of Systems  Constitutional: Negative.   HENT: Negative.   Eyes: Negative.   Respiratory: Negative.   Cardiovascular: Negative.   Gastrointestinal: Negative.   Genitourinary: Negative.   Musculoskeletal: Negative.   Skin: Negative.   Neurological: Negative.   Endo/Heme/Allergies: Negative.   Psychiatric/Behavioral: Positive for depression (Stabilized with medication prior to discharge) and substance abuse (Alcoholism). Negative for suicidal ideas, hallucinations and memory loss. The patient is nervous/anxious (Stabilized with medication prior to discharge) and has insomnia (Stabilized with medication prior to discharge).    Axis Diagnosis:   AXIS I:  Alcohol dependence, Major depressive disorder, recurrent episode, severe, without mention of psychotic behavior AXIS II:  Deferred AXIS III:   Past Medical History  Diagnosis Date  . Depression   . Elevated testosterone level in female   . Pernicious anemia   . ADD (attention deficit disorder)   . ETOH abuse   . Lyme disease   . Sinus infection 02/10/2012    Pt reports this as a current problem  . Menopause 02/10/2012   AXIS IV:  other psychosocial or environmental problems and Alcohol dependence AXIS V:  63  Level of Care:  [facility]  Hospital Course: Last year, she came here for alcohol detox due to family intervention and she was "good with that" and went to [facility] for about 20 days, maintained her sobriety until 3 weeks  ago. 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 Last Thursday, she [place] "[place]" but it did not work, got a DWI. Also, picked up a loaded gun at her mother's house but put it down because she did not want to do it in her mother's house. She got caught drinking by her children yesterday and they did an intervention to get help. [name] was attending AA but she would go to the bar afterwards and drink. Things started spiraling downwards after her husband abruptly left 3 years ago and she had lost her job prior to this. Multiple, multiple issues in a short period--found her neighbor after a suicide, in menopause, son got sick then suicidal, sexual addiction---lost her identity with her husband's leaving due to his narcissim,....her dad left her abruptly in 9th grade.   Upon admission into this hospital, and after admission assessment/evaluation, it was determined that patient will need detoxification treatment to stabilize her system of alcohol intoxication and to combat the withdrawal symptoms of this substance as well. And her discharge plans included a referral to a long term treatment facility for more intense substance abuse treatment. [name] was then started on Librium protocol for her alcohol detoxification. She was also enrolled in group counseling sessions and activities to learn coping skills that should help her after discharge to cope better, manage her substance abuse problems to maintain a much longer sobriety. She also was enrolled and attended AA/NA meetings being offered and held on this unit. She has some previous and or identifiable medical  conditions that required treatment and or monitoring. She received medication management for all those health issues as well. She was monitored closely for any potential problems that may arise as a result of and or during detoxification treatment.  Patient tolerated her treatment regimen and detoxification treatment without any significant adverse effects and or reactions reported.  Besides detoxification treatment protocol, [name] also was ordered and received; Citalopram 40 mg daily for depression, Seroquel 50 mg twice daily and 100 mg Q hs for mood control and Trazodone 50 mg Q bedtime for sleep. Patient attended treatment team meeting this am and met with the team staff. Her reason for admission, present symptoms, substance abuse issues, response to treatment and discharge plans discussed. Patient endorsed that she is doing well and stable for discharge to pursue the next phase of her substance abuse treatment. It was then agreed that she will be discharged to continue further substance abuse treatment at the [address] of [place], in [place]. [name] will leave [facility] today and straight to the [facility]. The address, date, time contact information for this treatment center provided for patient in writing. She was encouraged to join/attend AA/NA meetings being offered and held within her community after her treatment at [facility].   Upon discharge, patient adamantly denies suicidal, homicidal ideations, auditory, visual hallucinations, delusional thinking and or withdrawal symptoms. Patient left [facility] with all personal belongings in no apparent distress. She received 2 weeks worth supply samples of her discharge medications. Transportation per family.   Consults:  psychiatry  Significant Diagnostic Studies:  labs: CBC with diff, CMP, UDS, Toxicology, U/A  Discharge Vitals:   Blood pressure 113/82, pulse 88, temperature 97.6 F (36.4 C), temperature source Oral, resp. rate 16, height 5\' 6"  (1.676 m), weight 71.215 kg (157 lb). Body mass index is 25.35 kg/(m^2). Lab Results:   No results found for this or any previous visit (from the past 72 hour(s)).  Physical Findings: AIMS: Facial and Oral Movements Muscles of Facial  Expression: None, normal Lips and Perioral Area: None, normal Jaw: None, normal Tongue: None, normal,Extremity Movements Upper (arms, wrists, hands, fingers): None, normal Lower (legs, knees, ankles, toes): None, normal, Trunk Movements Neck, shoulders, hips: None, normal, Overall Severity Severity of abnormal movements (highest score from questions above): None, normal Incapacitation due to abnormal movements: None, normal Patient's awareness of abnormal movements (rate only patient's report): No Awareness, Dental Status Current problems with teeth and/or dentures?: No Does patient usually wear dentures?: No  CIWA:  CIWA-Ar Total: 0 COWS:     Psychiatric Specialty Exam: See Psychiatric Specialty Exam and Suicide Risk Assessment completed by Attending Physician prior to discharge.  Discharge destination:  Other:  [place]  Is patient on multiple antipsychotic therapies at discharge:  No   Has Patient had three or more failed trials of antipsychotic monotherapy by history:  No  Recommended Plan for Multiple Antipsychotic Therapies: NA     Medication List    STOP taking these medications       VITAMIN B-12 IJ      TAKE these medications     Indication   buPROPion 300 MG 24 hr tablet  Commonly known as:  WELLBUTRIN XL  Take 1 tablet (300 mg total) by mouth daily. For depression   Indication:  Major Depressive Disorder     cetirizine 10 MG tablet  Commonly known as:  ZYRTEC  Take 1 tablet (10 mg total) by mouth daily as needed for allergies.   Indication:  Perennial Rhinitis     cholecalciferol 1000 UNITS tablet  Commonly known as:  VITAMIN D  Take 1 tablet (1,000 Units total) by mouth daily. For bone health   Indication:  Bone Health     citalopram 40 MG tablet  Commonly known as:  CELEXA  Take 1 tablet (40 mg total) by mouth daily. For depression   Indication:  Depression     drospirenone-estradiol 0.5-1 MG per tablet  Commonly known as:  ANGELIQ  Take 1 tablet  by mouth daily. For Vasomotor symptoms   Indication:  Moderate to Severe Vasomotor Symptoms     ketoconazole 2 % shampoo  Commonly known as:  NIZORAL  Apply 1 application topically every other day. For dandruff   Indication:  Dandruff     QUEtiapine 50 MG tablet  Commonly known as:  SEROQUEL  Take 1 tablet (50 mg) twice daily and 2 tablets (100 mg) at bed time for sleep/mood control   Indication:  Mood control     traZODone 100 MG tablet  Commonly known as:  DESYREL  Take 1 tablet (100 mg total) by mouth at bedtime. For sleep   Indication:  Trouble Sleeping       Follow-up Information   Follow up with [facility]. (Admission today. Call after discharge and ask for Emma-verify what to brring and let her know what time you expect to arrive. )    Contact information:   [address] [place], [place] [address] phone: [phone] fax: [phone]     Follow-up recommendations:  Activity:  As tolerated Diet: As recommended by your primary care doctor. Keep all scheduled follow-up appointments as recommended. Continue to work your relapse prevention plan  Comments:  Take all your medications as prescribed by your mental healthcare provider. Report any adverse effects and or reactions from your medicines to your outpatient provider promptly. Patient is instructed and cautioned to not engage in alcohol and or illegal drug use while on prescription medicines. In the event of worsening symptoms, patient is instructed to call the crisis hotline, 911 and or go to the nearest ED for appropriate evaluation and treatment of symptoms. Follow-up with your primary care provider for your other medical issues, concerns and or health care needs.    Total Discharge Time:  Greater than 30 minutes.  Signed: Sanjuana Kava, FNP, PMHNP-BC 12/21/2012, 5:11 PM Agree with assessment and plan Reymundo Poll. Dub Mikes, M.D.

## 2012-12-21 NOTE — Progress Notes (Signed)
D) Pt is being discharged with family and will be going to [facility] Today. Pt states  she is going directly there and feels safe that she will be able to maintain her sobriety. Denies SI and HI today. States she is looking forward to going to [place] and "getting myself back".  A) Given support and reassurance along with praise. All patients belongings returned to her. R) Denies SI and HI presently. Given encouragement snf praise.

## 2012-12-25 NOTE — Progress Notes (Signed)
Patient Discharge Instructions:  After Visit Summary (AVS):   Faxed to:  12/25/12 Discharge Summary Note:   Faxed to:  12/25/12 Psychiatric Admission Assessment Note:   Faxed to:  12/25/12 Suicide Risk Assessment - Discharge Assessment:   Faxed to:  12/25/12 Faxed/Sent to the Next Level Care provider:  12/25/12 Faxed to [facility] @ [phone]  Jerelene Redden, 12/25/2012, 3:51 PM

## 2013-01-23 NOTE — Progress Notes (Signed)
Agree with assessment and plan Jazier Mcglamery A. Dorinda Stehr, M.D. 

## 2013-02-14 ENCOUNTER — Ambulatory Visit (INDEPENDENT_AMBULATORY_CARE_PROVIDER_SITE_OTHER): Payer: BC Managed Care – HMO | Admitting: Psychiatry

## 2013-02-14 ENCOUNTER — Encounter (HOSPITAL_COMMUNITY): Payer: Self-pay | Admitting: Psychiatry

## 2013-02-14 VITALS — BP 107/71 | HR 78 | Ht 66.0 in | Wt 172.0 lb

## 2013-02-14 DIAGNOSIS — F3110 Bipolar disorder, current episode manic without psychotic features, unspecified: Secondary | ICD-10-CM

## 2013-02-14 DIAGNOSIS — F1021 Alcohol dependence, in remission: Secondary | ICD-10-CM

## 2013-02-14 DIAGNOSIS — F312 Bipolar disorder, current episode manic severe with psychotic features: Secondary | ICD-10-CM

## 2013-02-14 DIAGNOSIS — F1020 Alcohol dependence, uncomplicated: Secondary | ICD-10-CM

## 2013-02-14 MED ORDER — QUETIAPINE FUMARATE 400 MG PO TABS: 400.0000 mg | ORAL_TABLET | Freq: Every day | ORAL | Status: DC
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
## 2013-02-16 DIAGNOSIS — F3110 Bipolar disorder, current episode manic without psychotic features, unspecified: Secondary | ICD-10-CM | POA: Insufficient documentation

## 2013-02-21 LAB — LIPID PANEL
HDL: 67 mg/dL (ref >39)
LDL Cholesterol: 112 mg/dL — ABNORMAL HIGH (ref 0–99)
Total CHOL/HDL Ratio: 2.9 Ratio

## 2013-02-21 LAB — HEMOGLOBIN A1C
Hgb A1c MFr Bld: 5.4 % (ref <5.7)
Mean Plasma Glucose: 108 mg/dL (ref <117)

## 2013-03-01 ENCOUNTER — Telehealth (HOSPITAL_COMMUNITY): Payer: Self-pay

## 2013-03-01 ENCOUNTER — Encounter (HOSPITAL_COMMUNITY): Payer: Self-pay | Admitting: Psychiatry

## 2013-03-01 ENCOUNTER — Ambulatory Visit (INDEPENDENT_AMBULATORY_CARE_PROVIDER_SITE_OTHER): Payer: BC Managed Care – HMO | Admitting: Psychiatry

## 2013-03-01 VITALS — BP 101/67 | HR 94 | Ht 66.0 in | Wt 176.0 lb

## 2013-03-01 DIAGNOSIS — F312 Bipolar disorder, current episode manic severe with psychotic features: Secondary | ICD-10-CM

## 2013-03-01 DIAGNOSIS — F1021 Alcohol dependence, in remission: Secondary | ICD-10-CM

## 2013-03-01 MED ORDER — BUPROPION HCL ER (XL) 150 MG PO TB24: 450.0000 mg | ORAL_TABLET | Freq: Every day | ORAL | Status: DC

## 2013-03-01 MED ORDER — CITALOPRAM HYDROBROMIDE 40 MG PO TABS: 40.0000 mg | ORAL_TABLET | Freq: Every day | ORAL | Status: DC

## 2013-03-01 MED ORDER — QUETIAPINE FUMARATE 400 MG PO TABS: 400.0000 mg | ORAL_TABLET | Freq: Every day | ORAL | Status: DC

## 2013-03-01 NOTE — Telephone Encounter (Signed)
First attempt to call patient. No answer and unable to leave a message.
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
## 2013-03-04 ENCOUNTER — Telehealth (HOSPITAL_COMMUNITY): Payer: Self-pay

## 2013-03-05 NOTE — Telephone Encounter (Signed)
Acknowledged.

## 2013-03-15 ENCOUNTER — Telehealth (HOSPITAL_COMMUNITY): Payer: Self-pay

## 2013-03-15 NOTE — Telephone Encounter (Signed)
Called patient back. Unable to leave voicemail.

## 2013-03-15 NOTE — Telephone Encounter (Signed)
PT WAS ACCIDENTALLY TAKING 600MG  WELLBUTRIN IN AM AND 300MG  AFTERNOON. PT TOOK WHAT SHE WAS SUPPOSED TO TODAY.

## 2013-03-29 ENCOUNTER — Encounter (HOSPITAL_COMMUNITY): Payer: Self-pay | Admitting: Psychiatry

## 2013-03-29 ENCOUNTER — Ambulatory Visit (INDEPENDENT_AMBULATORY_CARE_PROVIDER_SITE_OTHER): Payer: BC Managed Care – HMO | Admitting: Psychiatry

## 2013-03-29 VITALS — BP 91/61 | HR 96 | Ht 66.0 in | Wt 171.5 lb

## 2013-03-29 DIAGNOSIS — F312 Bipolar disorder, current episode manic severe with psychotic features: Secondary | ICD-10-CM

## 2013-03-29 DIAGNOSIS — F1021 Alcohol dependence, in remission: Secondary | ICD-10-CM

## 2013-03-29 MED ORDER — QUETIAPINE FUMARATE 100 MG PO TABS: ORAL_TABLET | ORAL | Status: DC

## 2013-03-29 MED ORDER — BUPROPION HCL ER (XL) 150 MG PO TB24: 450.0000 mg | ORAL_TABLET | Freq: Every day | ORAL | Status: DC

## 2013-03-29 NOTE — Patient Instructions (Signed)
CALL IN 2 WEEKS-before increasing dose of a seroquel.
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
## 2013-04-02 ENCOUNTER — Ambulatory Visit (INDEPENDENT_AMBULATORY_CARE_PROVIDER_SITE_OTHER): Payer: BC Managed Care – HMO | Admitting: Behavioral Health

## 2013-04-02 ENCOUNTER — Encounter (HOSPITAL_COMMUNITY): Payer: Self-pay | Admitting: Behavioral Health

## 2013-04-02 DIAGNOSIS — F3160 Bipolar disorder, current episode mixed, unspecified: Secondary | ICD-10-CM

## 2013-04-02 DIAGNOSIS — F3110 Bipolar disorder, current episode manic without psychotic features, unspecified: Secondary | ICD-10-CM
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
## 2013-04-05 ENCOUNTER — Ambulatory Visit (INDEPENDENT_AMBULATORY_CARE_PROVIDER_SITE_OTHER): Payer: BC Managed Care – HMO | Admitting: Sports Medicine

## 2013-04-05 ENCOUNTER — Ambulatory Visit (INDEPENDENT_AMBULATORY_CARE_PROVIDER_SITE_OTHER): Payer: BC Managed Care – HMO

## 2013-04-05 ENCOUNTER — Encounter: Payer: Self-pay | Admitting: Sports Medicine

## 2013-04-05 VITALS — BP 123/83 | HR 92 | Wt 178.0 lb

## 2013-04-05 DIAGNOSIS — M1710 Unilateral primary osteoarthritis, unspecified knee: Secondary | ICD-10-CM

## 2013-04-05 DIAGNOSIS — Z299 Encounter for prophylactic measures, unspecified: Secondary | ICD-10-CM | POA: Insufficient documentation

## 2013-04-05 DIAGNOSIS — Z8619 Personal history of other infectious and parasitic diseases: Secondary | ICD-10-CM | POA: Insufficient documentation

## 2013-04-05 DIAGNOSIS — M25469 Effusion, unspecified knee: Secondary | ICD-10-CM

## 2013-04-05 DIAGNOSIS — M25569 Pain in unspecified knee: Secondary | ICD-10-CM

## 2013-04-05 DIAGNOSIS — M25562 Pain in left knee: Secondary | ICD-10-CM

## 2013-04-05 MED ORDER — MELOXICAM 15 MG PO TABS: ORAL_TABLET | ORAL | Status: DC

## 2013-04-05 NOTE — Progress Notes (Signed)
  Subjective:    CC: Left knee pain  HPI: This is a 52 year old female with a history of multiple left patellar dislocations requiring manual reduction who presents with several years of worsening left knee pain. She describes the pain as intermittent, sharp and stabbing. The pain is along the lateral border of the kneecap. It is worse when climbing stairs and when planting her foot while doing yoga. There is occasional swelling, popping and grinding. She reports milder but similar symptoms with the right knee. She has not yet tried anti-inflammatories.   Past medical history, Surgical history, Family history not pertinant except as noted below, Social history, Allergies, and medications have been entered into the medical record, reviewed, and no changes needed.   Review of Systems: No headache, visual changes, nausea, vomiting, diarrhea, constipation, dizziness, abdominal pain, skin rash, fevers, chills, night sweats, swollen lymph nodes, weight loss, chest pain, body aches, joint swelling, muscle aches, shortness of breath, mood changes, visual or auditory hallucinations.  Objective:    General: Well Developed, well nourished, and in no acute distress.  Neuro: Alert and oriented x3, extra-ocular muscles intact, sensation grossly intact.  HEENT: Normocephalic, atraumatic, pupils equal round reactive to light, neck supple, no masses, no lymphadenopathy, thyroid nonpalpable.  Skin: Warm and dry, no rashes noted.  Cardiac: Regular rate and rhythm, no murmurs rubs or gallops.  Respiratory: Clear to auscultation bilaterally. Not using accessory muscles, speaking in full sentences.  Abdominal: Soft, nontender, nondistended, positive bowel sounds, no masses, no organomegaly.  Musculoskeletal: Shoulder, elbow, wrist, hip, knee, ankle stable, and with full range of motion. Left Knee: Normal to inspection with no erythema or effusion or obvious bony abnormalities. Tender to palpation along the lateral  border of the patella ROM full in flexion and extension and lower leg rotation. Ligaments with solid consistent endpoints including ACL, PCL, LCL, MCL. Negative Mcmurray's, Apley's, and Thessalonian tests. Non painful patellar compression. Patellar glide without crepitus. Patellar and quadriceps tendons unremarkable. Hamstring and quadriceps strength is normal.    Impression and Recommendations:    The patient was counselled, risk factors were discussed, anticipatory guidance given.  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  Plan: 1. Meloxicam 15mg  daily for 2 weeks, then as needed for pain relief 2. Wear a knee brace for support 3. Formal physical therapy  This note was originally written by Karin Lieu MS3.

## 2013-04-05 NOTE — Assessment & Plan Note (Signed)
Symptoms are predominantly patellofemoral status post multiple patellar dislocations. We will start conservatively with PT, Mobic, x-rays.  Return in 6 weeks to see how things are going.  We can consider injection if no better.

## 2013-04-05 NOTE — Assessment & Plan Note (Signed)
Asymptomatic, had 90 days of doxycycline, resolved.

## 2013-04-05 NOTE — Assessment & Plan Note (Signed)
Declines influenza vaccination, up-to-date on tetanus, cervical cancer screening, colon cancer screening.

## 2013-04-11 ENCOUNTER — Ambulatory Visit: Payer: BC Managed Care – HMO | Admitting: Physical Therapy

## 2013-04-11 DIAGNOSIS — M25569 Pain in unspecified knee: Secondary | ICD-10-CM

## 2013-04-11 DIAGNOSIS — M6281 Muscle weakness (generalized): Secondary | ICD-10-CM

## 2013-04-16 ENCOUNTER — Encounter (INDEPENDENT_AMBULATORY_CARE_PROVIDER_SITE_OTHER): Payer: BC Managed Care – HMO | Admitting: Physical Therapy

## 2013-04-16 ENCOUNTER — Encounter (HOSPITAL_COMMUNITY): Payer: Self-pay | Admitting: Behavioral Health

## 2013-04-16 ENCOUNTER — Ambulatory Visit (INDEPENDENT_AMBULATORY_CARE_PROVIDER_SITE_OTHER): Payer: BC Managed Care – HMO | Admitting: Behavioral Health

## 2013-04-16 DIAGNOSIS — M25569 Pain in unspecified knee: Secondary | ICD-10-CM

## 2013-04-16 DIAGNOSIS — M6281 Muscle weakness (generalized): Secondary | ICD-10-CM

## 2013-04-16 DIAGNOSIS — F319 Bipolar disorder, unspecified: Secondary | ICD-10-CM
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
## 2013-04-26 ENCOUNTER — Other Ambulatory Visit: Payer: Self-pay | Admitting: Sports Medicine

## 2013-04-26 ENCOUNTER — Encounter: Payer: Self-pay | Admitting: Sports Medicine

## 2013-04-26 ENCOUNTER — Encounter: Payer: BC Managed Care – HMO | Admitting: Physical Therapy

## 2013-04-26 ENCOUNTER — Ambulatory Visit (INDEPENDENT_AMBULATORY_CARE_PROVIDER_SITE_OTHER): Payer: BC Managed Care – HMO | Admitting: Sports Medicine

## 2013-04-26 ENCOUNTER — Ambulatory Visit (INDEPENDENT_AMBULATORY_CARE_PROVIDER_SITE_OTHER): Payer: BC Managed Care – HMO | Admitting: Psychiatry

## 2013-04-26 ENCOUNTER — Encounter (HOSPITAL_COMMUNITY): Payer: Self-pay | Admitting: Psychiatry

## 2013-04-26 VITALS — BP 127/78 | HR 92 | Wt 178.0 lb

## 2013-04-26 VITALS — BP 123/86 | HR 93 | Ht 66.0 in | Wt 186.0 lb

## 2013-04-26 DIAGNOSIS — K219 Gastro-esophageal reflux disease without esophagitis: Secondary | ICD-10-CM | POA: Insufficient documentation

## 2013-04-26 DIAGNOSIS — M25569 Pain in unspecified knee: Secondary | ICD-10-CM

## 2013-04-26 DIAGNOSIS — M6281 Muscle weakness (generalized): Secondary | ICD-10-CM

## 2013-04-26 DIAGNOSIS — D510 Vitamin B12 deficiency anemia due to intrinsic factor deficiency: Secondary | ICD-10-CM

## 2013-04-26 DIAGNOSIS — F1021 Alcohol dependence, in remission: Secondary | ICD-10-CM

## 2013-04-26 DIAGNOSIS — E669 Obesity, unspecified: Secondary | ICD-10-CM | POA: Insufficient documentation

## 2013-04-26 DIAGNOSIS — J387 Other diseases of larynx: Secondary | ICD-10-CM

## 2013-04-26 DIAGNOSIS — M25562 Pain in left knee: Secondary | ICD-10-CM

## 2013-04-26 DIAGNOSIS — F312 Bipolar disorder, current episode manic severe with psychotic features: Secondary | ICD-10-CM

## 2013-04-26 MED ORDER — CYANOCOBALAMIN 1000 MCG/ML IJ SOLN: 1000.0000 ug | INTRAMUSCULAR | Status: DC

## 2013-04-26 MED ORDER — QUETIAPINE FUMARATE 100 MG PO TABS: ORAL_TABLET | ORAL | Status: DC

## 2013-04-26 MED ORDER — OMEPRAZOLE 40 MG PO CPDR: 40.0000 mg | DELAYED_RELEASE_CAPSULE | Freq: Every day | ORAL | Status: DC

## 2013-04-26 NOTE — Progress Notes (Signed)
  Subjective:    CC: Follow up  HPI: Patellofemoral syndrome: Is now completely resolved with NSAIDs, and physical therapy with kinesiotaping.  Pernicious anemia: Typically gets vitamin B12 injections, 1000 mcg every 3 weeks, needs a refill.  Acid reflux: Would like a prescription for Prilosec.   Obesity: Has been gaining some weight since going on an atypical antipsychotic, she discussed this with her psychiatrist who asked her to followup with me for consideration of nutritionist referral. She is not yet ready to discuss pharmacologic management as well.  Past medical history, Surgical history, Family history not pertinant except as noted below, Social history, Allergies, and medications have been entered into the medical record, reviewed, and no changes needed.   Review of Systems: No fevers, chills, night sweats, weight loss, chest pain, or shortness of breath.   Objective:    General: Well Developed, well nourished, and in no acute distress.  Neuro: Alert and oriented x3, extra-ocular muscles intact, sensation grossly intact.  HEENT: Normocephalic, atraumatic, pupils equal round reactive to light, neck supple, no masses, no lymphadenopathy, thyroid nonpalpable.  Skin: Warm and dry, no rashes. Cardiac: Regular rate and rhythm, no murmurs rubs or gallops, no lower extremity edema.  Respiratory: Clear to auscultation bilaterally. Not using accessory muscles, speaking in full sentences.  Impression and Recommendations:

## 2013-04-26 NOTE — Assessment & Plan Note (Signed)
Continue vitamin B12 1000 mcg injected q. three weeks at home. Checking B12, folate, CBC, TSH.

## 2013-04-26 NOTE — Assessment & Plan Note (Signed)
Nutritionist referral. We can discuss phentermine at future visits.
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
## 2013-04-26 NOTE — Assessment & Plan Note (Signed)
Prilosec 40 mg at dinnertime

## 2013-04-26 NOTE — Assessment & Plan Note (Signed)
Resolved with PT and Kinesiotaping.

## 2013-04-27 LAB — CBC
HCT: 39.7 % (ref 36.0–46.0)
Hemoglobin: 13.9 g/dL (ref 12.0–15.0)
MCH: 33.4 pg (ref 26.0–34.0)
MCHC: 35 g/dL (ref 30.0–36.0)
MCV: 95.4 fL (ref 78.0–100.0)
Platelets: 324 K/uL (ref 150–400)
RBC: 4.16 MIL/uL (ref 3.87–5.11)
RDW: 13.4 % (ref 11.5–15.5)
WBC: 9.5 10*3/uL (ref 4.0–10.5)

## 2013-04-27 LAB — FOLATE: Folate: >20 ng/mL

## 2013-04-27 LAB — TSH: TSH: 3.104 u[IU]/mL (ref 0.350–4.500)

## 2013-04-27 LAB — VITAMIN B12: Vitamin B-12: 352 pg/mL (ref 211–911)

## 2013-04-30 ENCOUNTER — Ambulatory Visit (INDEPENDENT_AMBULATORY_CARE_PROVIDER_SITE_OTHER): Payer: BC Managed Care – HMO | Admitting: Behavioral Health

## 2013-04-30 ENCOUNTER — Telehealth (HOSPITAL_COMMUNITY): Payer: Self-pay

## 2013-04-30 ENCOUNTER — Encounter (HOSPITAL_COMMUNITY): Payer: Self-pay | Admitting: Behavioral Health

## 2013-04-30 DIAGNOSIS — F319 Bipolar disorder, unspecified: Secondary | ICD-10-CM

## 2013-04-30 NOTE — Telephone Encounter (Signed)
Called pharmacy. Clarified seroquel prescription.Marland Kitchen

## 2013-04-30 NOTE — Progress Notes (Signed)
   THERAPIST PROGRESS NOTE  Session Time: 10:00  Participation Level: Active  Behavioral Response: CasualAlertAnxious  Type of Therapy: Individual Therapy  Treatment Goals addressed: Coping  Interventions: CBT  Summary: [name] is a 52 y.o. female who presents with anxiety.   Suicidal/Homicidal: Nowithout intent/plan  Therapist Response: The client was somewhat tearful during the session. She indicated that she had relapsed drinking one and one half beers after being sober for 4 and 1/2 months. She was disappointed in herself as well as sad that she has let her sister down. She indicated that she wants to talk to her sister and brother-in-law tonight he was concerned that the brother-in-law may be upset enough that he will not let her continue to live in their home. We talked about the language that she could use in talking to her and loss. We talked about the possibility of her living with her best friend at least temporarily. We talked about things that she could get involved in to keep her mind her hands busy. We talked about allowing herself to Delorise Shiner and what it takes to begin to forgive ourselves. The client did say that she had some suicidal thoughts last night but did not have suicidal thoughts today. She did say that if she had suicidal thoughts she would call her best friend or let her family know. I encouraged her to call the crisis hotline or 911 if she could not reach anyone else and she agreed to do so. The client does contract for safety until her next session saying she has been thoughts of hurting herself or anyone else and last night was result of disappointed in herself as well as her family. She did agree to call her former sponsor this afternoon as well as contact a potential new sponsor who lives closer to her.  Plan: Return again in 2 weeks.  Diagnosis: Axis I: 296.8    Axis II: Deferred    French Ana, [facility] 04/30/2013

## 2013-05-03 ENCOUNTER — Encounter (INDEPENDENT_AMBULATORY_CARE_PROVIDER_SITE_OTHER): Payer: BC Managed Care – HMO

## 2013-05-03 DIAGNOSIS — M25569 Pain in unspecified knee: Secondary | ICD-10-CM

## 2013-05-03 DIAGNOSIS — M6281 Muscle weakness (generalized): Secondary | ICD-10-CM

## 2013-05-07 ENCOUNTER — Telehealth (HOSPITAL_COMMUNITY): Payer: Self-pay

## 2013-05-07 NOTE — Telephone Encounter (Signed)
Called patient. Clarified medications. Seroquel 550 mg for 14 days.

## 2013-05-07 NOTE — Telephone Encounter (Signed)
Pt confused on serequel needs clarification please

## 2013-05-08 ENCOUNTER — Telehealth (HOSPITAL_COMMUNITY): Payer: Self-pay

## 2013-05-08 NOTE — Telephone Encounter (Signed)
Pt did well on medication last night

## 2013-05-08 NOTE — Telephone Encounter (Signed)
Called patient. Unable to reach patient. VM "not set up yet." Acknowledged message that patient is doing well.

## 2013-05-09 ENCOUNTER — Other Ambulatory Visit (HOSPITAL_COMMUNITY): Payer: Self-pay | Admitting: Psychiatry

## 2013-05-14 ENCOUNTER — Ambulatory Visit (INDEPENDENT_AMBULATORY_CARE_PROVIDER_SITE_OTHER): Payer: BC Managed Care – HMO | Admitting: Behavioral Health

## 2013-05-14 ENCOUNTER — Encounter (HOSPITAL_COMMUNITY): Payer: Self-pay | Admitting: Behavioral Health

## 2013-05-14 DIAGNOSIS — F319 Bipolar disorder, unspecified: Secondary | ICD-10-CM
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
## 2013-05-21 ENCOUNTER — Ambulatory Visit (HOSPITAL_COMMUNITY): Payer: Self-pay | Admitting: Behavioral Health

## 2013-05-31 ENCOUNTER — Ambulatory Visit (INDEPENDENT_AMBULATORY_CARE_PROVIDER_SITE_OTHER): Payer: BC Managed Care – HMO | Admitting: Psychiatry

## 2013-05-31 ENCOUNTER — Encounter (HOSPITAL_COMMUNITY): Payer: Self-pay | Admitting: Psychiatry

## 2013-05-31 VITALS — BP 95/63 | HR 100 | Ht 66.0 in | Wt 186.0 lb

## 2013-05-31 DIAGNOSIS — F312 Bipolar disorder, current episode manic severe with psychotic features: Secondary | ICD-10-CM

## 2013-05-31 DIAGNOSIS — F1020 Alcohol dependence, uncomplicated: Secondary | ICD-10-CM

## 2013-05-31 MED ORDER — QUETIAPINE FUMARATE 400 MG PO TABS: 400.0000 mg | ORAL_TABLET | Freq: Every day | ORAL | Status: DC

## 2013-05-31 MED ORDER — QUETIAPINE FUMARATE 200 MG PO TABS: 200.0000 mg | ORAL_TABLET | Freq: Every day | ORAL | Status: DC

## 2013-05-31 MED ORDER — CITALOPRAM HYDROBROMIDE 40 MG PO TABS: 40.0000 mg | ORAL_TABLET | Freq: Every day | ORAL | Status: DC

## 2013-05-31 MED ORDER — BUPROPION HCL ER (XL) 150 MG PO TB24: 450.0000 mg | ORAL_TABLET | Freq: Every day | ORAL | Status: DC
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
## 2013-06-03 ENCOUNTER — Encounter (HOSPITAL_COMMUNITY): Payer: Self-pay | Admitting: Behavioral Health

## 2013-06-03 ENCOUNTER — Ambulatory Visit (INDEPENDENT_AMBULATORY_CARE_PROVIDER_SITE_OTHER): Payer: BC Managed Care – HMO | Admitting: Behavioral Health

## 2013-06-03 DIAGNOSIS — F319 Bipolar disorder, unspecified: Secondary | ICD-10-CM

## 2013-06-03 NOTE — Progress Notes (Signed)
   THERAPIST PROGRESS NOTE  Session Time: 9:00  Participation Level: Active  Behavioral Response: NeatAlertAnxious  Type of Therapy: Individual Therapy  Treatment Goals addressed: Coping  Interventions: CBT  Summary: Torrin Frein is a 52 y.o. female who presents with depression/anxiety.   Suicidal/Homicidal: Nowithout intent/plan  Therapist Response: The presents with  moderate anxiety and depression. She indicated that her sister continues to express concerns about her that the client questions. The client does feel she is making progress and sometimes feels trapped. She is appreciative of the fact that her sister and family are providing a place for her and transportation until she can drive. She is licensed to drive but needs a [employer]. The client for some concern because her sister indicated that after they return from a holiday cruise able to speak to her. We talked about perceptions of the client in anticipation the conversation with her sister. We talked about ways that she could frame we've she is when she speaks to her sister because she indicates that she typically becomes too emotional and is not able to convey what she is feeling. She indicates that she continues to attend her  AA meetings on a regular basis. She indicates that she has had no alcohol since our last session and has not acted impulsively in terms of his indiscretions. We practiced coping techniques for staying focused on the present and not catastrophizing. We talked about counting for things that she can see, here, touch or counting blessings she feels her self mentally allowing her self to go to a negative place. Also gave her a relaxation CD which focuses on breathing and progressive muscle relaxation. The client does contract for safety saying she does not have suicidal thoughts or plan of hurting herself or anyone else. 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 She also indicates that about twice a week she does hear voices which last about 5 minutes. She did say that one of those was at her mother's house. She heard her grandmother's voice but recognize the fact that her grandmother lived directly across the street from her mother and seeing the house probably triggered that voice. She is making her doctor aware of that.  Plan: Return again in 2 weeks.  Diagnosis: Axis I: 296.8    Axis II: No diagnosis    Joram Venson M, [facility] 06/03/2013

## 2013-06-17 ENCOUNTER — Ambulatory Visit (HOSPITAL_COMMUNITY): Payer: Self-pay | Admitting: Behavioral Health

## 2013-06-24 ENCOUNTER — Ambulatory Visit (HOSPITAL_COMMUNITY): Payer: Self-pay | Admitting: Behavioral Health

## 2013-06-28 ENCOUNTER — Ambulatory Visit (HOSPITAL_COMMUNITY): Payer: Self-pay | Admitting: Behavioral Health

## 2013-07-10 ENCOUNTER — Ambulatory Visit (INDEPENDENT_AMBULATORY_CARE_PROVIDER_SITE_OTHER): Payer: BC Managed Care – HMO | Admitting: Behavioral Health

## 2013-07-10 DIAGNOSIS — F3110 Bipolar disorder, current episode manic without psychotic features, unspecified: Secondary | ICD-10-CM

## 2013-07-11 ENCOUNTER — Ambulatory Visit (INDEPENDENT_AMBULATORY_CARE_PROVIDER_SITE_OTHER): Payer: BC Managed Care – HMO | Admitting: Psychiatry

## 2013-07-11 ENCOUNTER — Encounter (HOSPITAL_COMMUNITY): Payer: Self-pay | Admitting: Psychiatry

## 2013-07-11 VITALS — BP 105/74 | HR 85 | Wt 193.0 lb

## 2013-07-11 DIAGNOSIS — F312 Bipolar disorder, current episode manic severe with psychotic features: Secondary | ICD-10-CM

## 2013-07-11 MED ORDER — QUETIAPINE FUMARATE 400 MG PO TABS: 400.0000 mg | ORAL_TABLET | Freq: Every day | ORAL | Status: DC

## 2013-07-11 MED ORDER — CITALOPRAM HYDROBROMIDE 40 MG PO TABS: 40.0000 mg | ORAL_TABLET | Freq: Every day | ORAL | Status: DC

## 2013-07-11 MED ORDER — QUETIAPINE FUMARATE 200 MG PO TABS: 200.0000 mg | ORAL_TABLET | Freq: Every day | ORAL | Status: DC

## 2013-07-11 MED ORDER — BUPROPION HCL ER (XL) 150 MG PO TB24: 450.0000 mg | ORAL_TABLET | Freq: Every day | ORAL | Status: DC
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
## 2013-07-12 ENCOUNTER — Ambulatory Visit (HOSPITAL_COMMUNITY): Payer: Self-pay | Admitting: Psychiatry

## 2013-07-12 ENCOUNTER — Encounter (HOSPITAL_COMMUNITY): Payer: Self-pay | Admitting: Behavioral Health

## 2013-07-12 NOTE — Progress Notes (Signed)
   THERAPIST PROGRESS NOTE  Session Time: 10:00  Participation Level: Active  Behavioral Response: NeatAlertAnxious  Type of Therapy: Individual Therapy  Treatment Goals addressed: Coping  Interventions: CBT  Summary: Jonathon Jordanlizabeth Roussin is a 53 y.o. female who presents with Bi-polar disorder.   Suicidal/Homicidal: Nowithout intent/plan  Therapist Response: It had been about 5 weeks since I had seen decline. She spent the holidays with her mother which he said was very positive. She reports a good relationship with her mother and indicates that she will be spending some more time with her mother in the upcoming weeks. She did say that she is medication compliant, attended AA meetings while in her mother's home town, did not drink, he did not have any indiscretions. She reports that she is beginning to feel more like herself and is thinking more clearly. She does report at least one auditory hallucination per week which she says she will address with Dr. Hilton CorkPuthevel at her next appointment. She does say that the hallucinations are not frightening but she would like not to have to deal with him.  She indicates that being at her mother's house has cut down on some of the family conflict with her sister and her family. She is clearly appreciative of the opportunity to stay with her sister and family at times feels as if it is an intrusion. 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 She does still say that she is attempting to drink everyday but is using coping skills we have talked about to avoid doing that as well as using her mother and stepfather and other friends and family as accountability partners.  She did begin to talk more about her past in terms of her marriage and some of the issues that led to separation and pending divorce. She states that she recognizes that her mental  health issues as well as alcohol use contributed to the marriage not working and is coming to terms with what park she played in that. I remarked to the client that she is thinking much more clearly and linearly and appears to becoming stronger and setting healthy boundaries for herself.  Plan: Return again in 2 weeks.  Diagnosis: Axis I: 296.4    Axis II: Deferred    French AnaETERS,Shaden Lacher M, [facility] 07/12/2013

## 2013-07-30 ENCOUNTER — Ambulatory Visit (HOSPITAL_COMMUNITY): Payer: Self-pay | Admitting: Psychiatry

## 2013-07-31 ENCOUNTER — Ambulatory Visit (HOSPITAL_COMMUNITY): Payer: Self-pay | Admitting: Behavioral Health

## 2013-07-31 ENCOUNTER — Ambulatory Visit (INDEPENDENT_AMBULATORY_CARE_PROVIDER_SITE_OTHER): Payer: BC Managed Care – HMO | Admitting: Behavioral Health

## 2013-07-31 ENCOUNTER — Encounter (HOSPITAL_COMMUNITY): Payer: Self-pay | Admitting: Behavioral Health

## 2013-07-31 ENCOUNTER — Telehealth (HOSPITAL_COMMUNITY): Payer: Self-pay

## 2013-07-31 DIAGNOSIS — F319 Bipolar disorder, unspecified: Secondary | ICD-10-CM

## 2013-07-31 NOTE — Progress Notes (Signed)
   THERAPIST PROGRESS NOTE  Session Time: 2:00  Participation Level: Active  Behavioral Response: CasualAlertDepressed  Type of Therapy: Individual Therapy  Treatment Goals addressed: Coping  Interventions: CBT  Summary: [name] is a 53 y.o. female who presents with depression.   Suicidal/Homicidal: Nowithout intent/plan  Therapist Response: The client stated that she which with her mom. She indicates that the good situation because she feels comfortable there. She is attending regular AA meetings. She indicates that she has had no alcohol since last meeting. She reports no other indiscretions. She is currently working on an application for disability. She is in the process of filling out divorce paperwork which has her up some feelings though reports that she is feeling much more comfortable on who she is so that is not too stressful. We did process some of the feelings as a related to the divorce but she appears to be able to compartmentalize those feelings recognizing that she was a different person as well as her husband at the time and that she is able to move forward. She also read the book that we talked about in the last session, the 5 love languages. She said that she recognized that she loves quality time and acts of service and recognize that she and her husband's love languages were completely different.  The client indicated that she has not slept well for the past 3 nights averaging about 3 hours per night. She could recognize any particular triggers. There has been no change in diet except that she is eating more healthy and getting more exercise. There have been no medication changes. She reports no other stressors other than the ones that we processed in the session today and she indicates that they are not overwhelming. I did ask her to leave a message for the psychiatrist to call her to make sure that it is not medication related. We talked about some relaxation  techniques that may help him going to sleep. She did say that while extremely tired she did have some visual hallucinations but feels they are connected to her fatigue and lack of sleep. She does contract for safety having no thoughts of hurting herself or anyone else.  Plan: Return again in 2 weeks.  Diagnosis: Axis I: 296.80    Axis II: Deferred    French AnaETERS,Manila Rommel M, [facility] 07/31/2013

## 2013-08-01 ENCOUNTER — Telehealth (HOSPITAL_COMMUNITY): Payer: Self-pay | Admitting: Psychiatry

## 2013-08-01 ENCOUNTER — Ambulatory Visit (HOSPITAL_COMMUNITY): Payer: Self-pay | Admitting: Behavioral Health

## 2013-08-01 NOTE — Telephone Encounter (Signed)
Called patient. She reports she has not been sleeping, been more productive, and visual hallucination,  but no impulsivity.  Increase seroquel to 700 mg tonight and call back tomorrow.

## 2013-08-02 NOTE — Telephone Encounter (Signed)
Unable to leave voicemail.

## 2013-08-08 ENCOUNTER — Telehealth: Payer: Self-pay

## 2013-08-08 DIAGNOSIS — D510 Vitamin B12 deficiency anemia due to intrinsic factor deficiency: Secondary | ICD-10-CM

## 2013-08-08 MED ORDER — CYANOCOBALAMIN 1000 MCG/ML IJ SOLN: 1000.0000 ug | INTRAMUSCULAR | Status: DC

## 2013-08-08 NOTE — Telephone Encounter (Signed)
Done

## 2013-08-08 NOTE — Telephone Encounter (Signed)
Patient called stated that she need a Rx for B12 injection changed to 1 every 2 weeks. She stated that she has noticed the difference when she takes it every 3 weeks. Rhonda Cunningham,CMA

## 2013-08-09 NOTE — Telephone Encounter (Signed)
Left message on patient vm advising her that Rx for B12 was sent to her pharmacy. Reilly Molchan,CMA

## 2013-08-12 ENCOUNTER — Telehealth (HOSPITAL_COMMUNITY): Payer: Self-pay

## 2013-08-14 NOTE — Telephone Encounter (Signed)
The patient reports she had been taking to people. She reports she has been talking to people that were not there. She had increased seroquel to 800 mg daily and is dong better.

## 2013-08-19 ENCOUNTER — Encounter (HOSPITAL_COMMUNITY): Payer: Self-pay | Admitting: Psychiatry

## 2013-08-19 ENCOUNTER — Ambulatory Visit (INDEPENDENT_AMBULATORY_CARE_PROVIDER_SITE_OTHER): Payer: BC Managed Care – HMO | Admitting: Psychiatry

## 2013-08-19 VITALS — BP 110/70 | HR 82 | Wt 192.0 lb

## 2013-08-19 DIAGNOSIS — F312 Bipolar disorder, current episode manic severe with psychotic features: Secondary | ICD-10-CM

## 2013-08-19 DIAGNOSIS — F1021 Alcohol dependence, in remission: Secondary | ICD-10-CM

## 2013-08-19 MED ORDER — CITALOPRAM HYDROBROMIDE 10 MG PO TABS: ORAL_TABLET | ORAL | Status: DC

## 2013-08-19 MED ORDER — QUETIAPINE FUMARATE 400 MG PO TABS: 800.0000 mg | ORAL_TABLET | Freq: Every day | ORAL | Status: DC
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
## 2013-08-23 ENCOUNTER — Encounter (HOSPITAL_COMMUNITY): Payer: Self-pay | Admitting: Behavioral Health

## 2013-09-04 ENCOUNTER — Encounter: Payer: BC Managed Care – HMO | Admitting: Obstetrics & Gynecology

## 2013-09-16 ENCOUNTER — Other Ambulatory Visit (HOSPITAL_COMMUNITY): Payer: Self-pay | Admitting: Psychiatry

## 2013-09-20 NOTE — Telephone Encounter (Signed)
Refill request for celexa denied. She has been tapered off this medication.

## 2013-09-30 ENCOUNTER — Ambulatory Visit (INDEPENDENT_AMBULATORY_CARE_PROVIDER_SITE_OTHER): Payer: 59 | Admitting: Psychiatry

## 2013-09-30 ENCOUNTER — Encounter (HOSPITAL_COMMUNITY): Payer: Self-pay | Admitting: Psychiatry

## 2013-09-30 VITALS — BP 103/76 | HR 72 | Wt 192.0 lb

## 2013-09-30 DIAGNOSIS — F312 Bipolar disorder, current episode manic severe with psychotic features: Secondary | ICD-10-CM

## 2013-09-30 DIAGNOSIS — F1021 Alcohol dependence, in remission: Secondary | ICD-10-CM

## 2013-09-30 MED ORDER — QUETIAPINE FUMARATE 400 MG PO TABS: 800.0000 mg | ORAL_TABLET | Freq: Every day | ORAL | Status: DC

## 2013-09-30 MED ORDER — CITALOPRAM HYDROBROMIDE 40 MG PO TABS: 40.0000 mg | ORAL_TABLET | Freq: Every day | ORAL | Status: DC

## 2013-09-30 MED ORDER — BUPROPION HCL ER (XL) 150 MG PO TB24: 450.0000 mg | ORAL_TABLET | Freq: Every day | ORAL | Status: DC
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
## 2013-12-02 ENCOUNTER — Ambulatory Visit (HOSPITAL_COMMUNITY): Payer: Self-pay | Admitting: Psychiatry

## 2013-12-03 ENCOUNTER — Ambulatory Visit (HOSPITAL_COMMUNITY): Payer: Self-pay | Admitting: Psychiatry

## 2014-01-20 IMAGING — CR DG KNEE COMPLETE 4+V*L*
3 series · 3 of 3 positions shown · non-contrast
Comparison: None.

CLINICAL DATA: Knee pain

EXAM:
LEFT KNEE - COMPLETE 4+ VIEW

[view not recorded (1 of 3)]
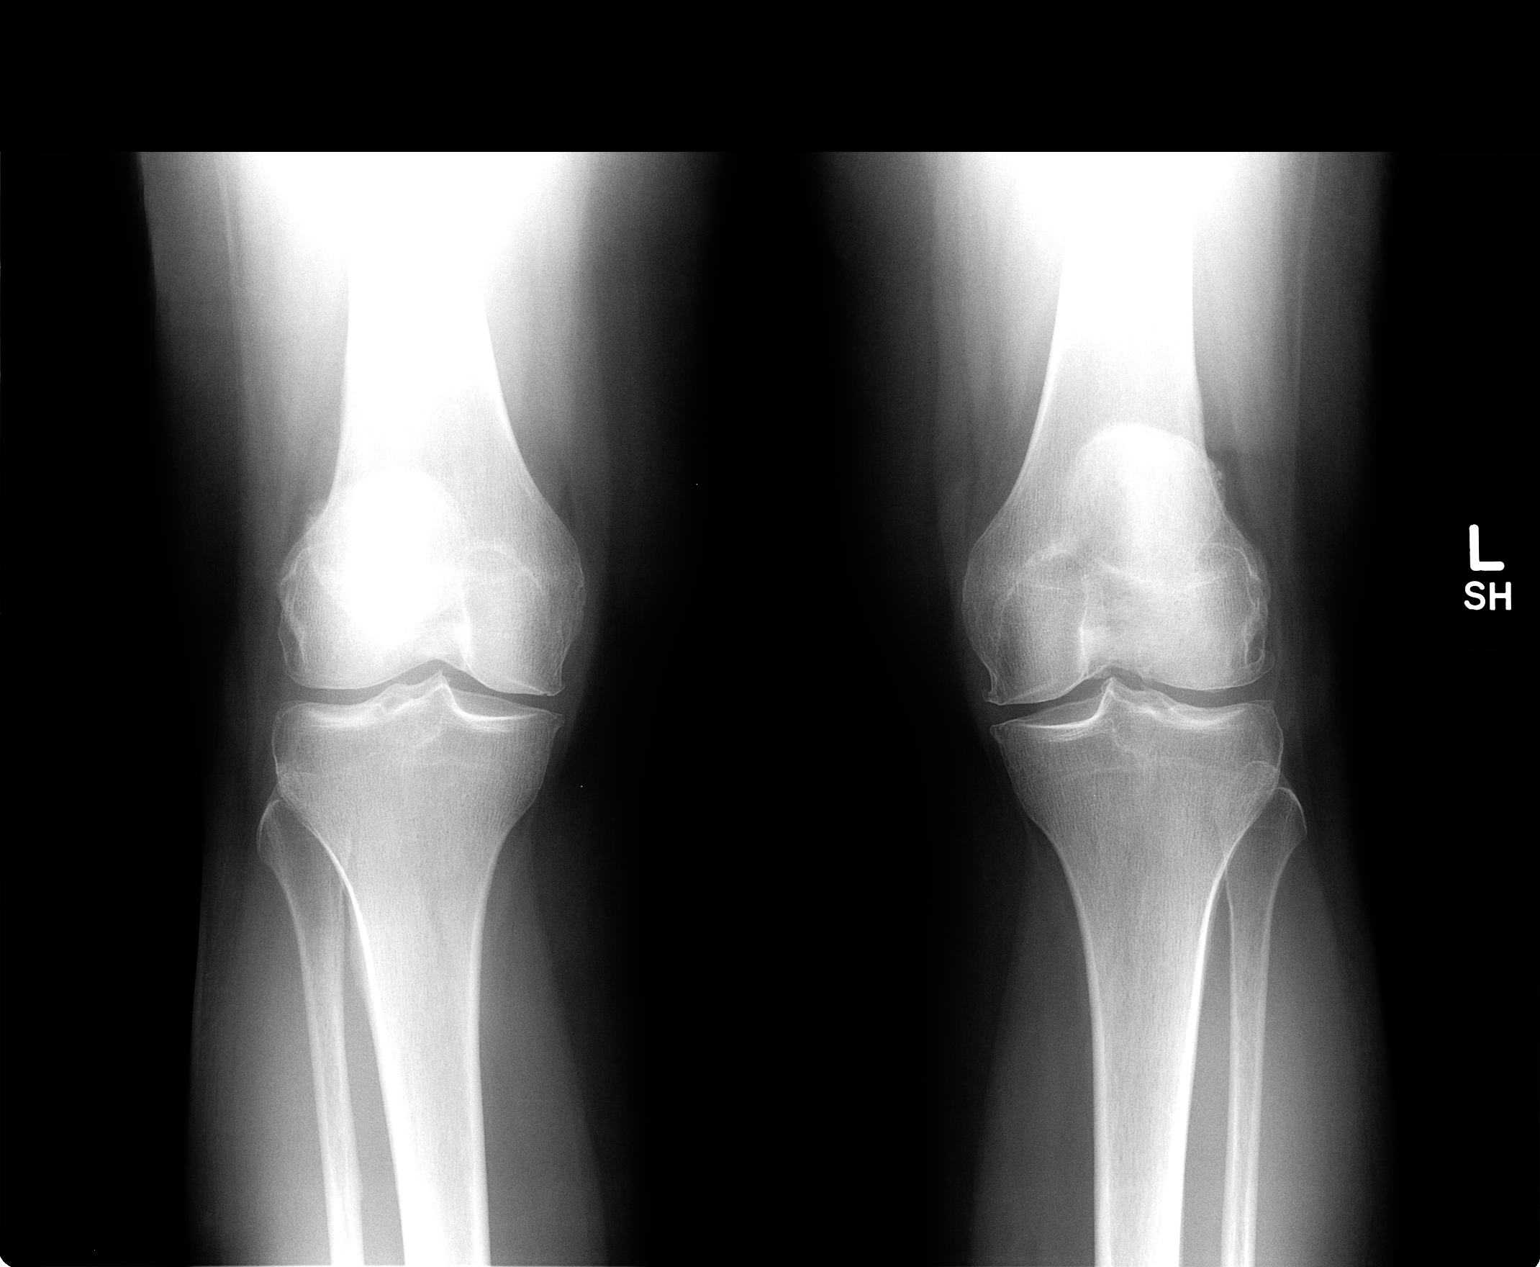

[view not recorded (2 of 3)]
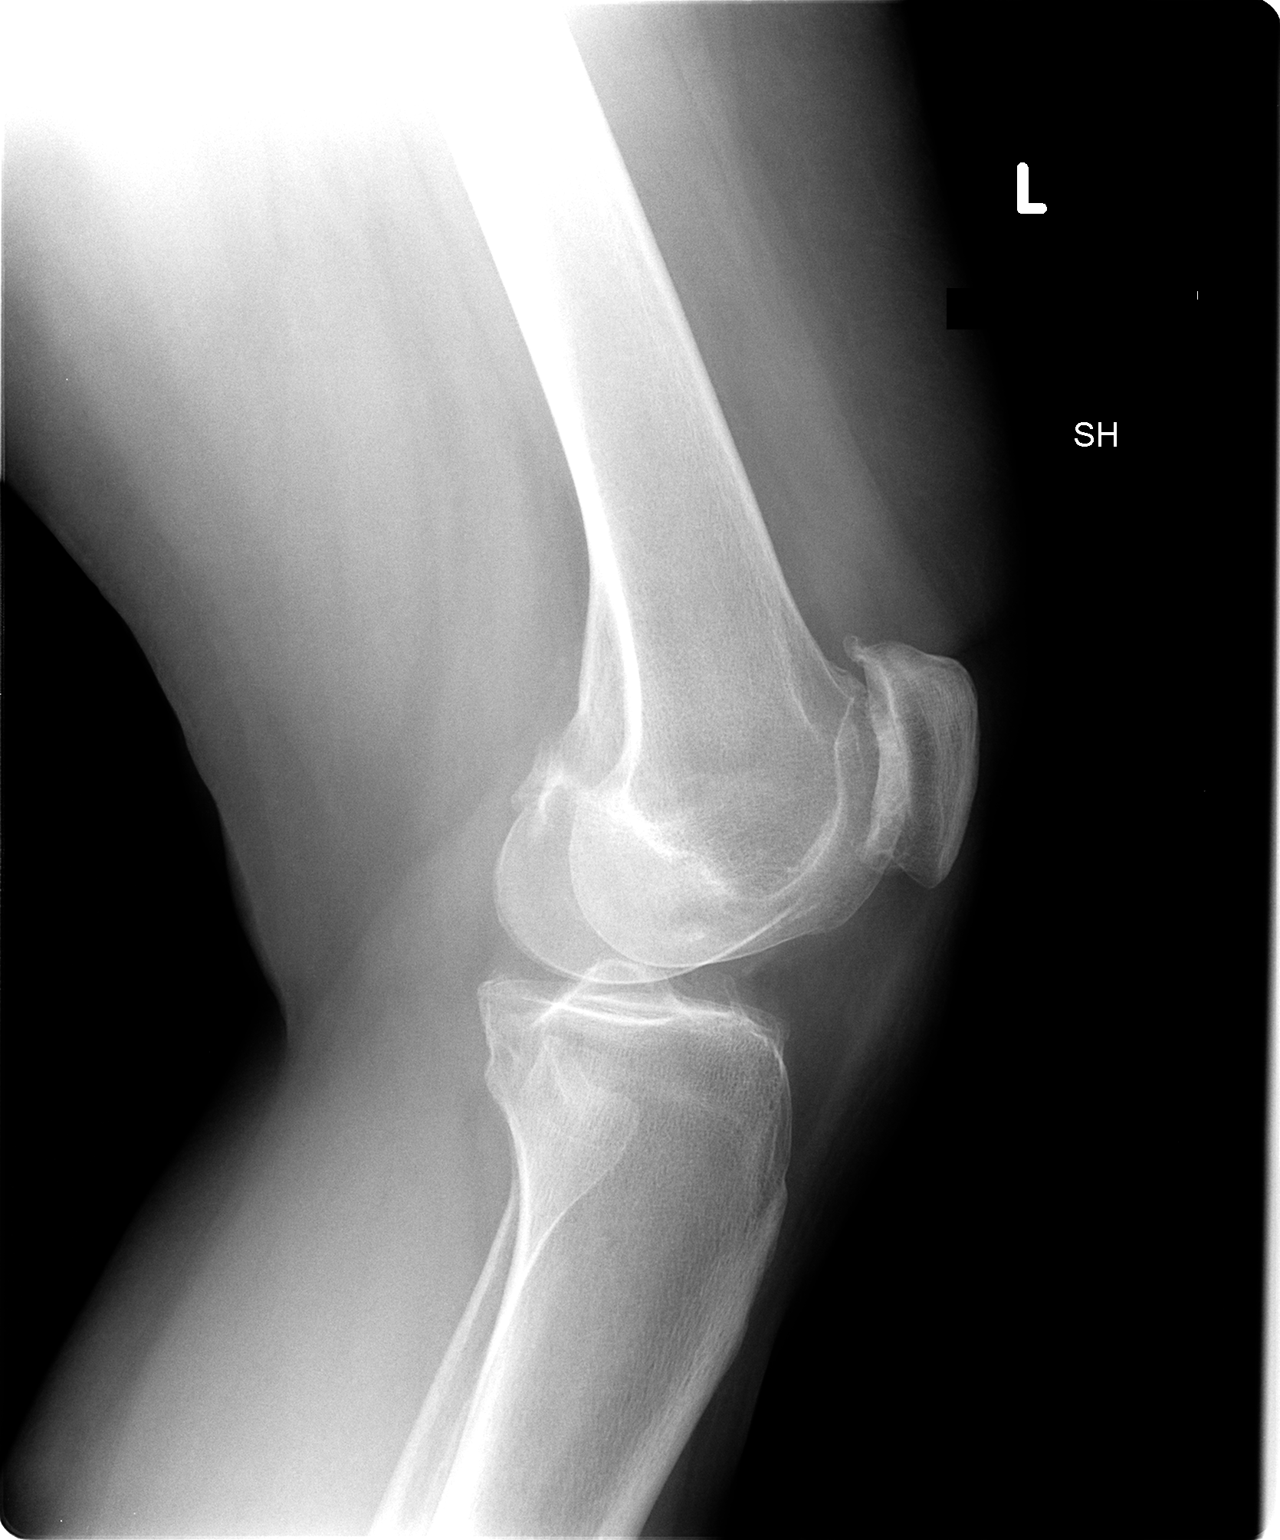

[view not recorded (3 of 3)]
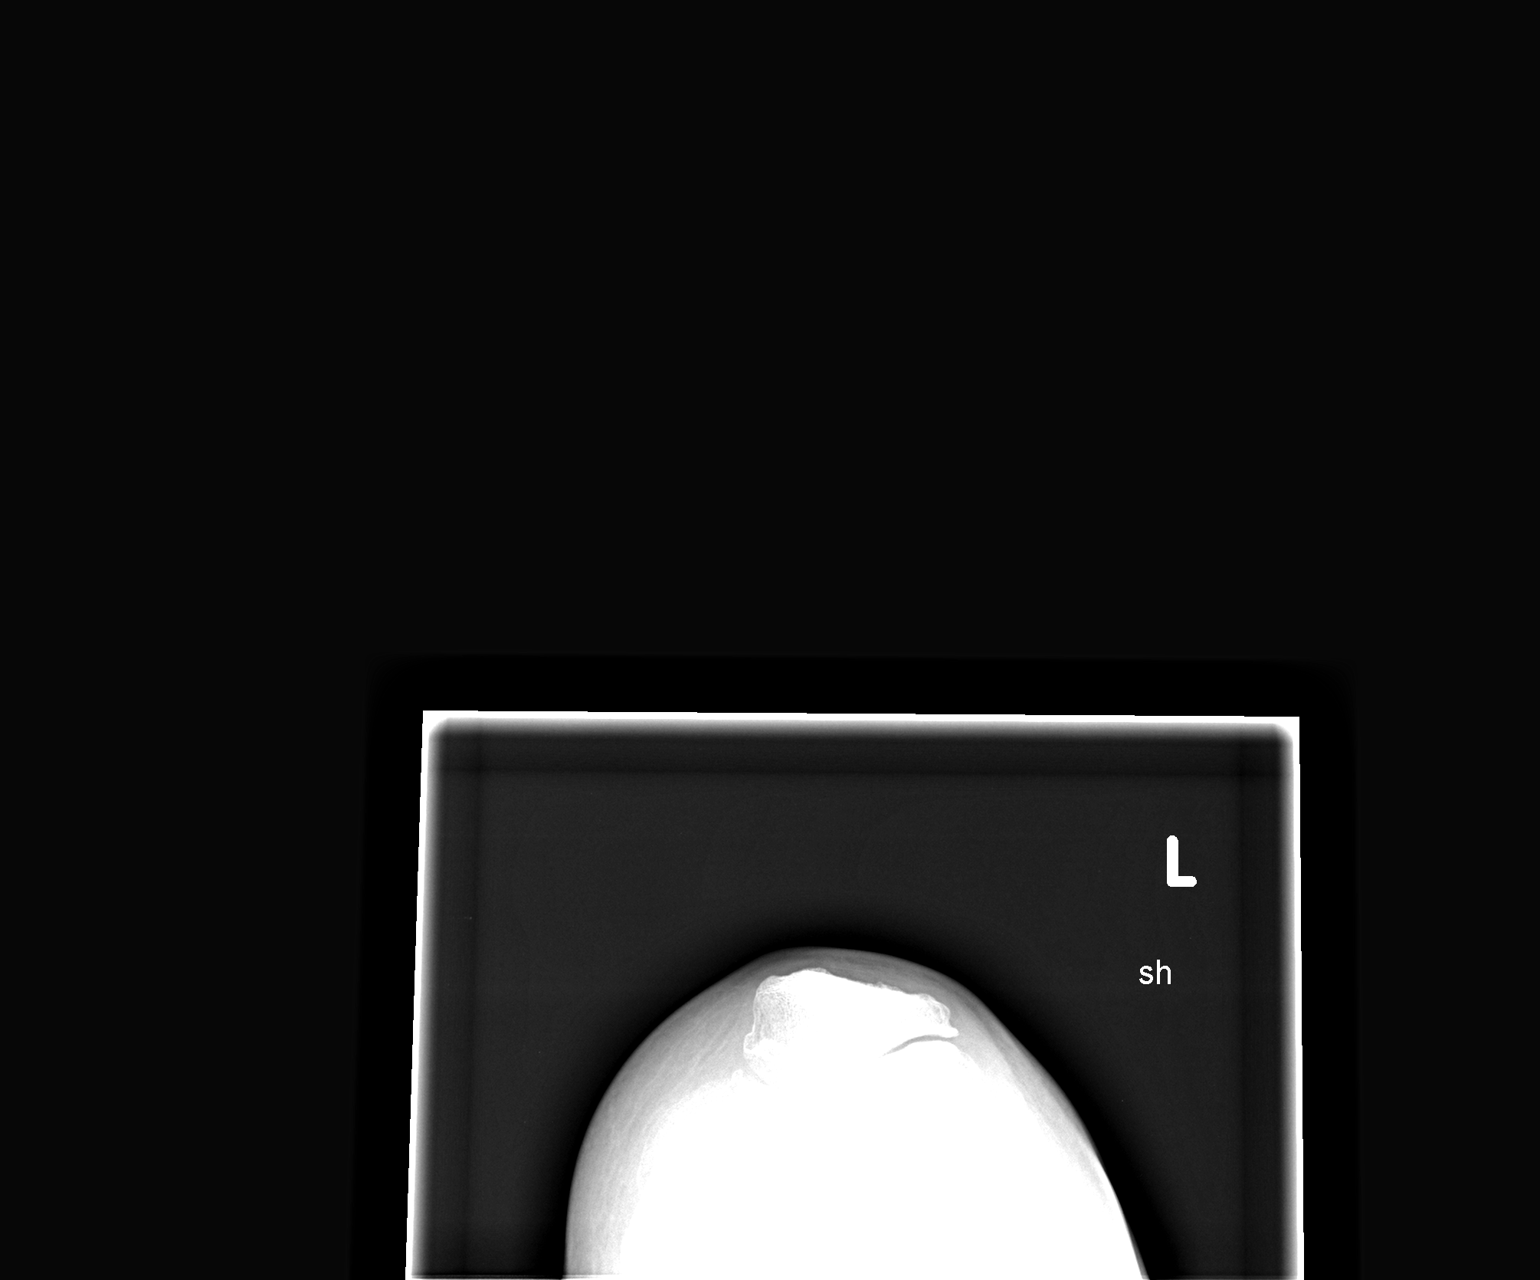

[3 of 3 positions shown; findings below may reference images not displayed]

FINDINGS: Standing views of the knees show relatively normal joint spaces with
primarily bicompartmental degenerative joint disease involving the
medial compartments, and the patella femoral compartment on the
left. No joint effusion is seen and no acute abnormality is noted. .
IMPRESSION: Bicompartmental degenerative joint disease of the left knee.

## 2014-03-28 ENCOUNTER — Ambulatory Visit (INDEPENDENT_AMBULATORY_CARE_PROVIDER_SITE_OTHER): Payer: 59 | Admitting: Psychiatry

## 2014-03-28 VITALS — BP 117/76 | HR 77 | Ht 66.0 in | Wt 184.0 lb

## 2014-03-28 DIAGNOSIS — F1021 Alcohol dependence, in remission: Secondary | ICD-10-CM

## 2014-03-28 DIAGNOSIS — F312 Bipolar disorder, current episode manic severe with psychotic features: Secondary | ICD-10-CM

## 2014-03-28 MED ORDER — LAMOTRIGINE 25 MG PO TABS: ORAL_TABLET | ORAL | Status: DC

## 2014-03-28 MED ORDER — QUETIAPINE FUMARATE 400 MG PO TABS: 800.0000 mg | ORAL_TABLET | Freq: Every day | ORAL | Status: DC

## 2014-03-28 MED ORDER — BUPROPION HCL ER (XL) 300 MG PO TB24: 300.0000 mg | ORAL_TABLET | Freq: Every day | ORAL | Status: DC

## 2014-03-28 MED ORDER — CITALOPRAM HYDROBROMIDE 40 MG PO TABS: 40.0000 mg | ORAL_TABLET | Freq: Every day | ORAL | Status: DC
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
## 2014-04-25 ENCOUNTER — Encounter (HOSPITAL_COMMUNITY): Payer: Self-pay | Admitting: Psychiatry

## 2014-04-25 ENCOUNTER — Ambulatory Visit (INDEPENDENT_AMBULATORY_CARE_PROVIDER_SITE_OTHER): Payer: 59 | Admitting: Psychiatry

## 2014-04-25 VITALS — BP 121/80 | HR 96 | Ht 65.0 in | Wt 183.0 lb

## 2014-04-25 DIAGNOSIS — F312 Bipolar disorder, current episode manic severe with psychotic features: Secondary | ICD-10-CM

## 2014-04-25 DIAGNOSIS — F1021 Alcohol dependence, in remission: Secondary | ICD-10-CM

## 2014-04-25 DIAGNOSIS — F1099 Alcohol use, unspecified with unspecified alcohol-induced disorder: Secondary | ICD-10-CM

## 2014-04-25 MED ORDER — LAMOTRIGINE 100 MG PO TABS: ORAL_TABLET | ORAL | Status: AC

## 2014-04-25 MED ORDER — QUETIAPINE FUMARATE 400 MG PO TABS: 800.0000 mg | ORAL_TABLET | Freq: Every day | ORAL | Status: DC

## 2014-04-25 MED ORDER — BUPROPION HCL ER (XL) 150 MG PO TB24: 150.0000 mg | ORAL_TABLET | Freq: Every day | ORAL | Status: AC

## 2014-04-25 MED ORDER — CITALOPRAM HYDROBROMIDE 40 MG PO TABS: 40.0000 mg | ORAL_TABLET | Freq: Every day | ORAL | Status: DC
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
## 2014-05-05 ENCOUNTER — Ambulatory Visit (HOSPITAL_COMMUNITY): Payer: Self-pay | Admitting: Licensed Clinical Social Worker

## 2014-05-08 ENCOUNTER — Telehealth (HOSPITAL_COMMUNITY): Payer: Self-pay | Admitting: *Deleted

## 2014-05-08 NOTE — Telephone Encounter (Signed)
Return call to pt. Per Dr. Gilmore LarocheAkhtar, please instruct pt to take the current dosage of Wellbutrin 150mg . PT shows understanding.

## 2014-05-08 NOTE — Telephone Encounter (Signed)
Pt called and would like to speak with Dr. Gilmore LarocheAkhtar. Pt would like to know if she is continuing Wellbutrin 300mg  or reduce medication to 150mg . Please call pt at  [phone].

## 2014-05-19 ENCOUNTER — Telehealth (HOSPITAL_COMMUNITY): Payer: Self-pay

## 2014-05-19 NOTE — Telephone Encounter (Signed)
PT has been taking 400mg  of Lamictal for 4 days by mistake. She thought it was the 25mg  pill. PT would like to know if she should just go back to 1 pill (100 mg) or if she needs to taper down.

## 2014-05-19 NOTE — Telephone Encounter (Signed)
Pt called stating she had mistaking taken 400mg  Lamictal. Per Dr. Gilmore LarocheAkhtar, pt informed to stop taking the 25mg  Lamictal for 2 days, then increase to 50mg  Lamictal. . Informed pt if she experience any symptoms, contact office. Pt states and shows understanding. Pt has an appt with Dr. Gilmore LarocheAkhtar on 12/8.

## 2014-05-26 ENCOUNTER — Ambulatory Visit (HOSPITAL_COMMUNITY): Payer: Self-pay | Admitting: Licensed Clinical Social Worker

## 2014-05-27 ENCOUNTER — Ambulatory Visit (HOSPITAL_COMMUNITY): Payer: Self-pay | Admitting: Psychiatry

## 2014-10-16 ENCOUNTER — Other Ambulatory Visit: Payer: Self-pay | Admitting: Sports Medicine

## 2014-11-28 ENCOUNTER — Emergency Department
Admission: EM | Admit: 2014-11-28 | Discharge: 2014-11-28 | Disposition: A | Discharge status: Home / Self Care | Payer: 59 | Source: Home / Self Care | Attending: Family Medicine | Admitting: Family Medicine

## 2014-11-28 ENCOUNTER — Encounter: Payer: Self-pay | Admitting: *Deleted

## 2014-11-28 ENCOUNTER — Telehealth: Payer: Self-pay | Admitting: Emergency Medicine

## 2014-11-28 DIAGNOSIS — R358 Other polyuria: Secondary | ICD-10-CM

## 2014-11-28 DIAGNOSIS — R1031 Right lower quadrant pain: Secondary | ICD-10-CM | POA: Diagnosis not present

## 2014-11-28 DIAGNOSIS — R3589 Other polyuria: Secondary | ICD-10-CM

## 2014-11-28 LAB — POCT URINALYSIS DIP (MANUAL ENTRY)
Glucose, UA: NEGATIVE
NITRITE UA: NEGATIVE
Spec Grav, UA: 1.02 (ref 1.005–1.03)
UROBILINOGEN UA: 0.2 (ref 0–1)
pH, UA: 6 (ref 5–8)

## 2014-11-28 LAB — POCT CBC W AUTO DIFF (K'VILLE URGENT CARE)

## 2014-11-28 NOTE — ED Provider Notes (Signed)
CSN: [id number]     Arrival date & time 11/28/14  4098 History   First MD Initiated Contact with Patient 11/28/14 [phone]     Chief Complaint  Patient presents with  . Abdominal Pain      HPI Comments: Patient complains of vague lower abdominal discomfort for 1.5 weeks. Two days ago patient developed lower abdominal "bloating," and at 9pm that night developed sharp right lower quadrant pain that radiated to her back.  At that time she had an episode of nausea/vomiting.  She has tried [employer] and Tums without relief, and yesterday tried a light diet.  During the past week she has had urinary frequency and urgency without dysuria.  She has had no changes in bowel movements. She has a history of acid reflux.  Patient's last menstrual period was 08/09/2009; no recent vaginal bleeding. Past history of cholecystectomy 20 years ago, and a C-section 26 years ago.   Last colonoscopy was done age 64, and negative. Family history of colon CA in maternal uncle and maternal grandfather.                                                                                                                                                 Patient is a 54 y.o. female presenting with abdominal pain. The history is provided by the patient.  Abdominal Pain Pain location:  RUQ Pain quality: sharp   Pain radiates to:  Back Pain severity:  Moderate Onset quality:  Gradual Duration:  2 days Timing:  Constant Progression:  Waxing and waning Chronicity:  New Context: previous surgery   Context: not alcohol use, not awakening from sleep, not diet changes, not laxative use, not medication withdrawal, not recent illness, not recent travel, not sick contacts, not suspicious food intake and not trauma   Relieved by:  Nothing Worsened by:  Nothing tried Ineffective treatments: PeptoBismol and Tums. Associated symptoms: flatus   Associated symptoms: no belching, no chest pain, no chills, no constipation, no cough, no  diarrhea, no dysuria, no fatigue, no fever, no hematemesis, no hematochezia, no hematuria, no melena, no nausea, no shortness of breath, no sore throat, no vaginal bleeding, no vaginal discharge and no vomiting   Risk factors: multiple surgeries and obesity     Past Medical History  Diagnosis Date  . Depression   . Elevated testosterone level in female   . Pernicious anemia   . ADD (attention deficit disorder)   . ETOH abuse   . Lyme disease   . Sinus infection 02/10/2012    Pt reports this as a current problem  . Menopause 02/10/2012  . Bipolar disorder    Past Surgical History  Procedure Laterality Date  . Cesarean section    . Knee surgery      left  . Cholecystectomy     Family History  Problem  Relation Age of Onset  . Alcohol abuse Maternal Uncle   . Alcohol abuse Maternal Grandfather    History  Substance Use Topics  . Smoking status: Current Some Day Smoker -- 0.00 packs/day for 1 years    Types: Cigarettes  . Smokeless tobacco: Never Used     Comment: The patient reports less than 1 cigarette a day at most and rarely 10 cigarettes a day.   . Alcohol Use: Yes     Comment: Recovering alcoholic   OB History    No data available     Review of Systems  Constitutional: Negative for fever, chills and fatigue.  HENT: Negative for sore throat.   Respiratory: Negative for cough and shortness of breath.   Cardiovascular: Negative for chest pain.  Gastrointestinal: Positive for abdominal pain and flatus. Negative for nausea, vomiting, diarrhea, constipation, melena, hematochezia and hematemesis.  Genitourinary: Negative for dysuria, hematuria, vaginal bleeding and vaginal discharge.  All other systems reviewed and are negative.   Allergies  Penicillins  Home Medications   Prior to Admission medications   Medication Sig Start Date End Date Taking? Authorizing Provider  buPROPion (WELLBUTRIN XL) 150 MG 24 hr tablet Take 1 tablet (150 mg total) by mouth daily.  04/25/14  Yes Thresa Ross, MD  lamoTRIgine (LAMICTAL) 100 MG tablet Take one tablet a day 04/25/14  Yes Thresa Ross, MD  QUEtiapine (SEROQUEL) 400 MG tablet Take 2 tablets (800 mg total) by mouth at bedtime. 04/25/14  Yes Thresa Ross, MD  cholecalciferol (VITAMIN D) 1000 UNITS tablet Take by mouth daily. For bone health 12/21/12   Sanjuana Kava, NP  cyanocobalamin (,VITAMIN B-12,) 1000 MCG/ML injection INJECT 1 ML INTO MUSCLE EVERY 14 DAYS 10/16/14   Monica Becton, MD  loratadine (CLARITIN) 10 MG tablet Take 10 mg by mouth daily.    Historical Provider, MD   BP 121/83 mmHg  Pulse 84  Temp(Src) 98.6 F (37 C) (Oral)  Resp 14  Ht  (1.676 m)  Wt 188 lb (85.276 kg)  BMI 30.36 kg/m2  SpO2 96%  LMP 08/09/2009 Physical Exam  Constitutional: She is oriented to person, place, and time. She appears well-developed and well-nourished. No distress.  Patient is obese (BMI 30.4)  HENT:  Head: Normocephalic.  Right Ear: External ear normal.  Left Ear: External ear normal.  Nose: Nose normal.  Mouth/Throat: Oropharynx is clear and moist.  Eyes: Conjunctivae are normal. Pupils are equal, round, and reactive to light.  Neck: Neck supple.  Cardiovascular: Normal heart sounds.   Pulmonary/Chest: Breath sounds normal.  Abdominal: Soft. Bowel sounds are normal. She exhibits no distension and no mass. There is tenderness. There is no rebound and no guarding.    Distinct tenderness to palpation over McBurney's point.  No rebound.  Normal bowel sounds.  Negative iliopsoas and obdurator tests.  Musculoskeletal: She exhibits no edema or tenderness.  Lymphadenopathy:    She has no cervical adenopathy.  Neurological: She is alert and oriented to person, place, and time.  Skin: Skin is warm and dry. No rash noted.  Nursing note and vitals reviewed.   ED Course  Procedures none    Labs Reviewed  URINE CULTURE  POCT CBC W AUTO DIFF ([facility]):  WBC 8.1; LY 35.7; MO 5.5; GR  58.8; Hgb 14.4; Platelets 283  POCT URINALYSIS DIP (MANUAL ENTRY):  BIL small; KET trace; BLO trace intact; PRO /dL; LEU small          MDM  1. Polyuria   2. Abdominal pain, right lower quadrant; ?etiology.  Normal WBC reassuring        Urine culture pending. Begin clear liquids for about 18 to 24 hours, then may begin a BRAT diet (Bananas, Rice, Applesauce, Toast) when nausea resolved.  Then gradually advance to a regular diet as tolerated.  Avoid milk products until well. May take Zofran for nausea.  If symptoms become significantly worse during the night or over the weekend, proceed to the local emergency room. Followup with PCP in about 4 days.    Lattie Haw, MD 12/05/14 1250

## 2014-11-28 NOTE — Discharge Instructions (Signed)
Begin clear liquids for about 18 to 24 hours, then may begin a BRAT diet (Bananas, Rice, Applesauce, Toast) when nausea resolved.  Then gradually advance to a regular diet as tolerated.  Avoid milk products until well. May take Zofran for nausea.  If symptoms become significantly worse during the night or over the weekend, proceed to the local emergency room.

## 2014-11-28 NOTE — ED Notes (Signed)
Pt c/o abdominal pain x 2 days. Vomited on first day. Also c/o increased flatulence, bloating and polyuria.  This am her stool was dark. Denies nausea, fever or diarrhea. She reports she is a recovering acholic and did drink about 4 days ago.

## 2014-11-29 LAB — URINE CULTURE: Colony Count: 25000

## 2014-11-30 ENCOUNTER — Telehealth: Payer: Self-pay | Admitting: Emergency Medicine

## 2014-12-03 ENCOUNTER — Ambulatory Visit: Payer: Self-pay | Admitting: Family Medicine

## 2014-12-12 ENCOUNTER — Encounter: Payer: Self-pay | Admitting: Obstetrics & Gynecology

## 2014-12-12 ENCOUNTER — Ambulatory Visit (INDEPENDENT_AMBULATORY_CARE_PROVIDER_SITE_OTHER): Payer: 59 | Admitting: Obstetrics & Gynecology

## 2014-12-12 VITALS — BP 119/79 | HR 89 | Ht 65.0 in | Wt 184.0 lb

## 2014-12-12 DIAGNOSIS — Z124 Encounter for screening for malignant neoplasm of cervix: Secondary | ICD-10-CM | POA: Diagnosis not present

## 2014-12-12 DIAGNOSIS — Z1151 Encounter for screening for human papillomavirus (HPV): Secondary | ICD-10-CM

## 2014-12-12 DIAGNOSIS — D510 Vitamin B12 deficiency anemia due to intrinsic factor deficiency: Secondary | ICD-10-CM

## 2014-12-12 DIAGNOSIS — Z01419 Encounter for gynecological examination (general) (routine) without abnormal findings: Secondary | ICD-10-CM

## 2014-12-12 DIAGNOSIS — Z113 Encounter for screening for infections with a predominantly sexual mode of transmission: Secondary | ICD-10-CM

## 2014-12-12 DIAGNOSIS — Z0000 Encounter for general adult medical examination without abnormal findings: Secondary | ICD-10-CM

## 2014-12-12 DIAGNOSIS — Z139 Encounter for screening, unspecified: Secondary | ICD-10-CM

## 2014-12-12 LAB — CBC
HCT: 41.9 % (ref 36.0–46.0)
Hemoglobin: 14.5 g/dL (ref 12.0–15.0)
MCH: 34.1 pg — ABNORMAL HIGH (ref 26.0–34.0)
MCHC: 34.6 g/dL (ref 30.0–36.0)
MCV: 98.6 fL (ref 78.0–100.0)
MPV: 9.6 fL (ref 8.6–12.4)
Platelets: 313 10*3/uL (ref 150–400)
RBC: 4.25 MIL/uL (ref 3.87–5.11)
RDW: 13.8 % (ref 11.5–15.5)
WBC: 7.3 10*3/uL (ref 4.0–10.5)

## 2014-12-12 NOTE — Progress Notes (Signed)
  Subjective:    [name] is a 54 y.o. female who presents for an annual exam. The patient has no complaints today. The patient is not currently sexually active. GYN screening history: last pap: was normal per pateint. The patient wears seatbelts: yes. The patient participates in regular exercise: not asked. Has the patient ever been transfused or tattooed?: not asked. The patient reports that there is not domestic violence in her life.   Patient going through long divorce--almost final [place] in recovery from alcoholism.  Pt had relapse 3 weeks ago (had reached 18 months),  Is back on track Applying for disability due to mental illness and seeing things.   Menstrual History: OB History    No data available      Patient had history of elevated testosterone levels.  Pt told she is in menopause in 2013 based on [facility] and no longer needed to be concerned with testosterone. Patient's last menstrual period was 08/09/2009.    The following portions of the patient's history were reviewed and updated as appropriate: allergies, current medications, past family history, past medical history, past social history, past surgical history and problem list.  Review of Systems A comprehensive review of systems was negative except for: Behavioral/Psych: positive for anxiety and mood swings    Objective:      Filed Vitals:   12/12/14 0824  BP: 119/79  Pulse: 89  Height: 5\' 5"  (1.651 m)  Weight: 184 lb (83.462 kg)   Vitals:  WNL General appearance: alert, cooperative and no distress Head: Normocephalic, without obvious abnormality, atraumatic Eyes: negative Throat: lips, mucosa, and tongue normal; teeth and gums normal Lungs: clear to auscultation bilaterally Breasts: normal appearance, no masses or tenderness, No nipple retraction or dimpling, No nipple discharge or bleeding Heart: regular rate and rhythm Abdomen: soft, non-tender; bowel sounds normal; no masses,  no  organomegaly  Pelvic:  External Genitalia:  Tanner V, no lesion Urethra:  No prolapse Vagina:  Pink, normal rugae, no blood or discharge Cervix:  No CMT, no lesion Uterus:  Normal size and contour, non tender Adnexa:  Normal, no masses, non tender  Extremities: no edema, redness or tenderness in the calves or thighs Skin: no lesions or rash; numerous SKs Lymph nodes: Axillary adenopathy: none    .    Assessment:    Healthy female exam.    Plan:     Mammogram. Thin prep Pap smear. Urinalysis.   Pap with cotesting--next pap 3-5 years HGB A1C and Vit B12 ordered for PCP Full STD panel per pt request Up to date on colonoscopy.  RTC 1 year

## 2014-12-12 NOTE — Patient Instructions (Signed)
Menopause Menopause is the normal time of life when menstrual periods stop completely. Menopause is complete when you have missed 12 consecutive menstrual periods. It usually occurs between the ages of 48 years and 55 years. Very rarely does a woman develop menopause before the age of 40 years. At menopause, your ovaries stop producing the female hormones estrogen and progesterone. This can cause undesirable symptoms and also affect your health. Sometimes the symptoms may occur 4-5 years before the menopause begins. There is no relationship between menopause and:  Oral contraceptives.  Number of children you had.  Race.  The age your menstrual periods started (menarche). Heavy smokers and very thin women may develop menopause earlier in life. CAUSES  The ovaries stop producing the female hormones estrogen and progesterone.  Other causes include:  Surgery to remove both ovaries.  The ovaries stop functioning for no known reason.  Tumors of the pituitary gland in the brain.  Medical disease that affects the ovaries and hormone production.  Radiation treatment to the abdomen or pelvis.  Chemotherapy that affects the ovaries. SYMPTOMS   Hot flashes.  Night sweats.  Decrease in sex drive.  Vaginal dryness and thinning of the vagina causing painful intercourse.  Dryness of the skin and developing wrinkles.  Headaches.  Tiredness.  Irritability.  Memory problems.  Weight gain.  Bladder infections.  Hair growth of the face and chest.  Infertility. More serious symptoms include:  Loss of bone (osteoporosis) causing breaks (fractures).  Depression.  Hardening and narrowing of the arteries (atherosclerosis) causing heart attacks and strokes. DIAGNOSIS   When the menstrual periods have stopped for 12 straight months.  Physical exam.  Hormone studies of the blood. TREATMENT  There are many treatment choices and nearly as many questions about them. The  decisions to treat or not to treat menopausal changes is an individual choice made with your health care provider. Your health care provider can discuss the treatments with you. Together, you can decide which treatment will work best for you. Your treatment choices may include:   Hormone therapy (estrogen and progesterone).  Non-hormonal medicines.  Treating the individual symptoms with medicine (for example antidepressants for depression).  Herbal medicines that may help specific symptoms.  Counseling by a psychiatrist or psychologist.  Group therapy.  Lifestyle changes including:  Eating healthy.  Regular exercise.  Limiting caffeine and alcohol.  Stress management and meditation.  No treatment. HOME CARE INSTRUCTIONS   Take the medicine your health care provider gives you as directed.  Get plenty of sleep and rest.  Exercise regularly.  Eat a diet that contains calcium (good for the bones) and soy products (acts like estrogen hormone).  Avoid alcoholic beverages.  Do not smoke.  If you have hot flashes, dress in layers.  Take supplements, calcium, and vitamin D to strengthen bones.  You can use over-the-counter lubricants or moisturizers for vaginal dryness.  Group therapy is sometimes very helpful.  Acupuncture may be helpful in some cases. SEEK MEDICAL CARE IF:   You are not sure you are in menopause.  You are having menopausal symptoms and need advice and treatment.  You are still having menstrual periods after age 55 years.  You have pain with intercourse.  Menopause is complete (no menstrual period for 12 months) and you develop vaginal bleeding.  You need a referral to a specialist (gynecologist, psychiatrist, or psychologist) for treatment. SEEK IMMEDIATE MEDICAL CARE IF:   You have severe depression.  You have excessive vaginal bleeding.    You fell and think you have a broken bone.  You have pain when you urinate.  You develop leg or  chest pain.  You have a fast pounding heart beat (palpitations).  You have severe headaches.  You develop vision problems.  You feel a lump in your breast.  You have abdominal pain or severe indigestion. Document Released: 08/27/2003 Document Revised: 02/06/2013 Document Reviewed: 01/03/2013 ExitCare Patient Information 2015 [employer]. This information is not intended to replace advice given to you by your health care provider. Make sure you discuss any questions you have with your health care provider.  

## 2014-12-13 LAB — VITAMIN B12: Vitamin B-12: 705 pg/mL (ref 211–911)

## 2014-12-13 LAB — GC/CHLAMYDIA PROBE AMP
CT Probe RNA: NEGATIVE
GC Probe RNA: NEGATIVE

## 2014-12-13 LAB — RPR

## 2014-12-13 LAB — HEMOGLOBIN A1C
Hgb A1c MFr Bld: 5.3 % (ref <5.7)
MEAN PLASMA GLUCOSE: 105 mg/dL (ref <117)

## 2014-12-13 LAB — HIV ANTIBODY (ROUTINE TESTING W REFLEX): HIV 1&2 Ab, 4th Generation: NONREACTIVE

## 2014-12-13 LAB — HEPATITIS B SURFACE ANTIGEN: HEP B S AG: NEGATIVE

## 2014-12-15 ENCOUNTER — Telehealth: Payer: Self-pay | Admitting: *Deleted

## 2014-12-15 NOTE — Telephone Encounter (Signed)
Pt notified of normal labs

## 2014-12-15 NOTE — Telephone Encounter (Signed)
-----   Message from Lesly DukesKelly H Leggett, MD sent at 12/14/2014  6:43 AM EDT ----- Call the patient and let them know their lab results are normal.  Thanks!!

## 2014-12-17 LAB — CYTOLOGY - PAP

## 2014-12-18 ENCOUNTER — Ambulatory Visit: Payer: Self-pay

## 2015-02-10 ENCOUNTER — Encounter: Payer: Self-pay | Admitting: Obstetrics & Gynecology

## 2016-05-03 ENCOUNTER — Telehealth: Payer: Self-pay | Admitting: *Deleted

## 2016-05-03 NOTE — Telephone Encounter (Signed)
Notified pt concerning her mammogram this year.  [place] in the [place] but there is no documentation of one done this year.  Pt states that she has not had one done this year.  I told pt that [facility] would [facility].  She stated that she would let me know.

## 2016-05-03 NOTE — Telephone Encounter (Signed)
-----   Message from Lesly DukesKelly H Leggett, MD sent at 04/29/2016  2:44 PM EST ----- It has been another year.  Can you call and see if she would like us to order another.  Thx!! ----- Message ----- From: Granville LewisLora L Clark, RN Sent: 03/01/2016   9:43 AM To: Lesly DukesKelly H Leggett, MD  The last one she had [place] [facility] [place].  It is scanned into the chart. ----- Message ----- From: Lesly DukesKelly H Leggett, MD Sent: 02/18/2016  12:01 PM To: [facility] [place] [facility]  Can you please call patient and see if she got her mammogram somewhere other than [place].  If not, then please remind her to get mammogram and document in a note.

## 2017-08-25 DIAGNOSIS — L929 Granulomatous disorder of the skin and subcutaneous tissue, unspecified: Secondary | ICD-10-CM | POA: Insufficient documentation

## 2017-11-08 ENCOUNTER — Encounter: Payer: Self-pay | Admitting: Obstetrics & Gynecology

## 2017-11-08 ENCOUNTER — Ambulatory Visit (INDEPENDENT_AMBULATORY_CARE_PROVIDER_SITE_OTHER): Payer: PRIVATE HEALTH INSURANCE | Admitting: Obstetrics & Gynecology

## 2017-11-08 VITALS — BP 117/70 | HR 82 | Resp 16 | Ht 66.0 in | Wt 170.0 lb

## 2017-11-08 DIAGNOSIS — Z124 Encounter for screening for malignant neoplasm of cervix: Secondary | ICD-10-CM | POA: Diagnosis not present

## 2017-11-08 DIAGNOSIS — Z01419 Encounter for gynecological examination (general) (routine) without abnormal findings: Secondary | ICD-10-CM

## 2017-11-08 DIAGNOSIS — Z113 Encounter for screening for infections with a predominantly sexual mode of transmission: Secondary | ICD-10-CM | POA: Diagnosis not present

## 2017-11-08 DIAGNOSIS — Z1151 Encounter for screening for human papillomavirus (HPV): Secondary | ICD-10-CM

## 2017-11-08 NOTE — Progress Notes (Signed)
Subjective:    [name] is a 57 y.o. divorced P2 (25 and 2 yo kids, no grands) female who presents for an annual exam. The patient has no complaints today. The patient is sexually active. GYN screening history: last pap: was normal. The patient wears seatbelts: yes. The patient participates in regular exercise: yes. Has the patient ever been transfused or tattooed?: no. The patient reports that there is not domestic violence in her life.   Menstrual History: OB History    Gravida  3   Para  2   Term      Preterm      AB  1   Living        SAB  1   TAB      Ectopic      Multiple      Live Births              Menarche age: 66 Patient's last menstrual period was 01/15/2010.    The following portions of the patient's history were reviewed and updated as appropriate: allergies, current medications, past family history, past medical history, past social history, past surgical history and problem list.  Review of Systems Pertinent items are noted in HPI.   Monogamous since 9/18, except for once last month and didn't use a condom Colonoscopy q 3 years FH- + colon cancer maternal GF, maternal uncle, + breast cancer in sister, diagnosed at 24, and in her paternal GM, no gyn cancer   Objective:    BP 117/70   Pulse 82   Resp 16   Ht  (1.676 m)   Wt 170 lb (77.1 kg)   LMP 01/15/2010   BMI 27.44 kg/m   General Appearance:    Alert, cooperative, no distress, appears stated age  Head:    Normocephalic, without obvious abnormality, atraumatic  Eyes:    PERRL, conjunctiva/corneas clear, EOM's intact, fundi    benign, both eyes  Ears:    Normal TM's and external ear canals, both ears  Nose:   Nares normal, septum midline, mucosa normal, no drainage    or sinus tenderness  Throat:   Lips, mucosa, and tongue normal; teeth and gums normal  Neck:   Supple, symmetrical, trachea midline, no adenopathy;    thyroid:  no enlargement/tenderness/nodules; no  carotid   bruit or JVD  Back:     Symmetric, no curvature, ROM normal, no CVA tenderness  Lungs:     Clear to auscultation bilaterally, respirations unlabored  Chest Wall:    No tenderness or deformity   Heart:    Regular rate and rhythm, S1 and S2 normal, no murmur, rub   or gallop  Breast Exam:    No tenderness, masses, or nipple abnormality  Abdomen:     Soft, non-tender, bowel sounds active all four quadrants,    no masses, no organomegaly  Genitalia:    Normal female without lesion, discharge or tenderness, normal size and shape, anteverted, mobile, non-tender, normal adnexal exam      Extremities:   Extremities normal, atraumatic, no cyanosis or edema  Pulses:   2+ and symmetric all extremities  Skin:   Skin color, texture, turgor normal, no rashes or lesions  Lymph nodes:   Cervical, supraclavicular, and axillary nodes normal  Neurologic:   CNII-XII intact, normal strength, sensation and reflexes    throughout  .    Assessment:    Healthy female exam.    Plan:     Thin  prep Pap smear. with cotesting STI testing per patient request [facility] testing

## 2017-11-09 LAB — CYTOLOGY - PAP
Chlamydia: NEGATIVE
Diagnosis: NEGATIVE
HPV: NOT DETECTED
Neisseria Gonorrhea: NEGATIVE

## 2017-11-09 LAB — HEPATITIS C ANTIBODY
Hepatitis C Ab: NONREACTIVE
SIGNAL TO CUT-OFF: 0.01 (ref <1.00)

## 2017-11-09 LAB — RPR: RPR: NONREACTIVE

## 2017-11-09 LAB — HEPATITIS B SURFACE ANTIGEN: Hepatitis B Surface Ag: NONREACTIVE

## 2017-11-09 LAB — HIV ANTIBODY (ROUTINE TESTING W REFLEX): HIV 1&2 Ab, 4th Generation: NONREACTIVE

## 2017-11-15 ENCOUNTER — Telehealth: Payer: Self-pay

## 2017-11-15 NOTE — Telephone Encounter (Signed)
Pt called wanting lab results. Pt is aware that pap smear and STI blood work was all negative.

## 2018-01-04 DIAGNOSIS — R42 Dizziness and giddiness: Secondary | ICD-10-CM | POA: Insufficient documentation

## 2018-01-05 ENCOUNTER — Other Ambulatory Visit (HOSPITAL_BASED_OUTPATIENT_CLINIC_OR_DEPARTMENT_OTHER): Payer: Self-pay | Admitting: Anesthesiology

## 2018-01-05 ENCOUNTER — Ambulatory Visit (HOSPITAL_BASED_OUTPATIENT_CLINIC_OR_DEPARTMENT_OTHER)
Admission: RE | Admit: 2018-01-05 | Discharge: 2018-01-05 | Disposition: A | Discharge status: Home / Self Care | Payer: Medicare (Managed Care) | Source: Ambulatory Visit | Attending: Anesthesiology | Admitting: Anesthesiology

## 2018-01-05 DIAGNOSIS — R42 Dizziness and giddiness: Secondary | ICD-10-CM | POA: Insufficient documentation

## 2018-01-05 DIAGNOSIS — R278 Other lack of coordination: Secondary | ICD-10-CM

## 2018-03-11 DIAGNOSIS — F3175 Bipolar disorder, in partial remission, most recent episode depressed: Secondary | ICD-10-CM | POA: Insufficient documentation

## 2018-03-11 DIAGNOSIS — K219 Gastro-esophageal reflux disease without esophagitis: Secondary | ICD-10-CM | POA: Insufficient documentation

## 2018-04-10 DIAGNOSIS — Z800 Family history of malignant neoplasm of digestive organs: Secondary | ICD-10-CM | POA: Insufficient documentation

## 2018-04-10 DIAGNOSIS — Z86010 Personal history of colonic polyps: Secondary | ICD-10-CM | POA: Insufficient documentation

## 2018-04-10 DIAGNOSIS — J3089 Other allergic rhinitis: Secondary | ICD-10-CM | POA: Insufficient documentation

## 2018-04-10 DIAGNOSIS — Z87891 Personal history of nicotine dependence: Secondary | ICD-10-CM | POA: Insufficient documentation

## 2018-04-10 DIAGNOSIS — H353190 Nonexudative age-related macular degeneration, unspecified eye, stage unspecified: Secondary | ICD-10-CM | POA: Insufficient documentation

## 2018-09-03 DIAGNOSIS — H8103 Meniere's disease, bilateral: Secondary | ICD-10-CM | POA: Insufficient documentation

## 2018-10-22 IMAGING — CT CT HEAD W/O CM
3 series · 15 of 47 positions shown, 18 images · non-contrast
Comparison: None.

CLINICAL DATA: 57-year-old female with vertigo and "swishing
feeling" in head for 3 weeks. No known injury.

EXAM:
CT HEAD WITHOUT CONTRAST
TECHNIQUE: Contiguous axial images were obtained from the base of the skull
through the vertex without intravenous contrast.

[Series 2: head wo · axial · 0.39mm/px · z∈[-215,-85]mm · 9 of 32 slices shown, 12 images]
[im 3/32  brain]
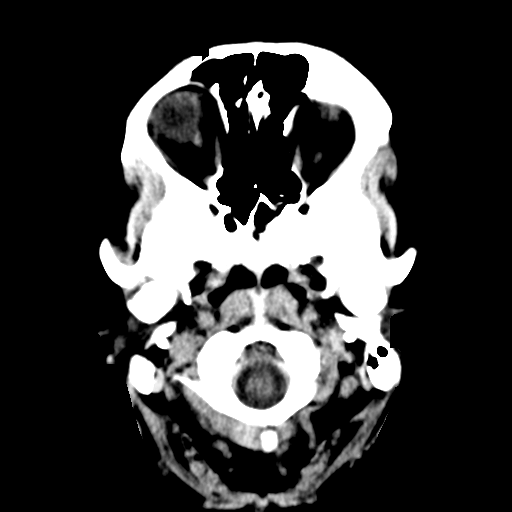
[im 3/32  bone]
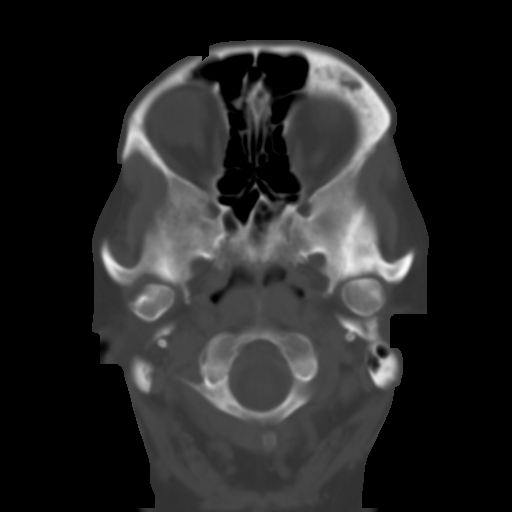
[im 6/32  brain]
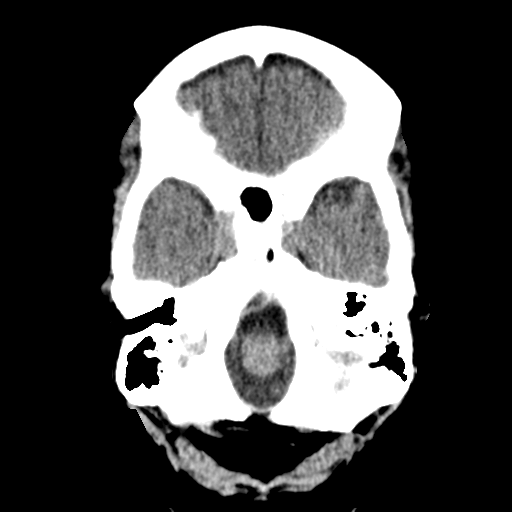
[im 9/32  brain]
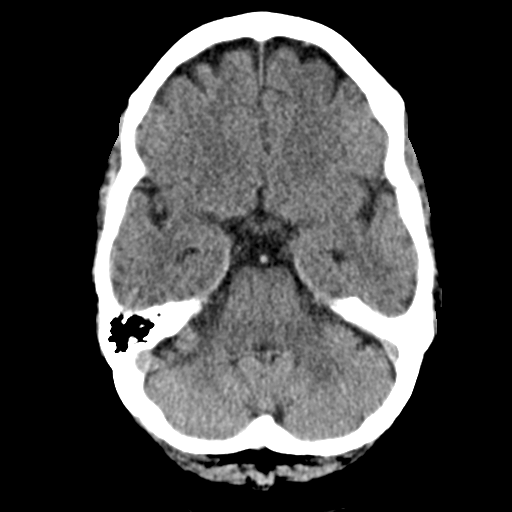
[im 12/32  brain]
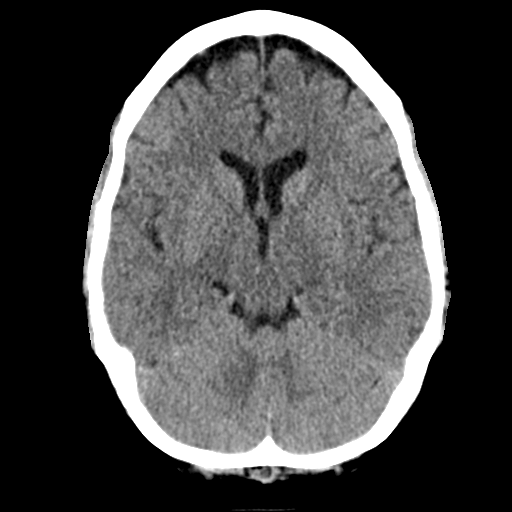
[im 17/32  brain]
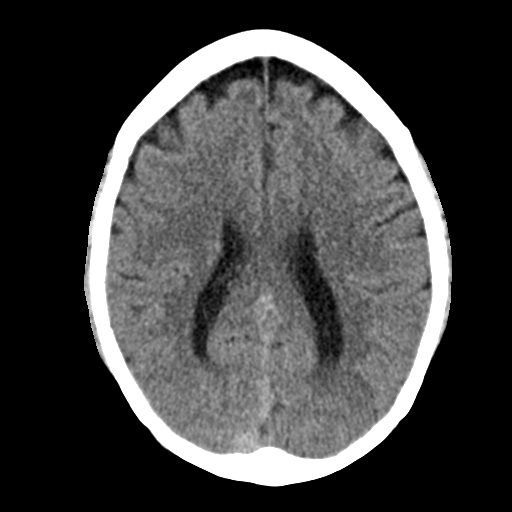
[im 17/32  bone]
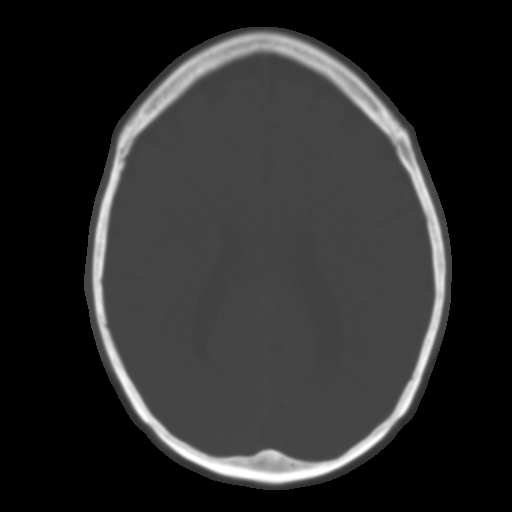
[im 20/32  brain]
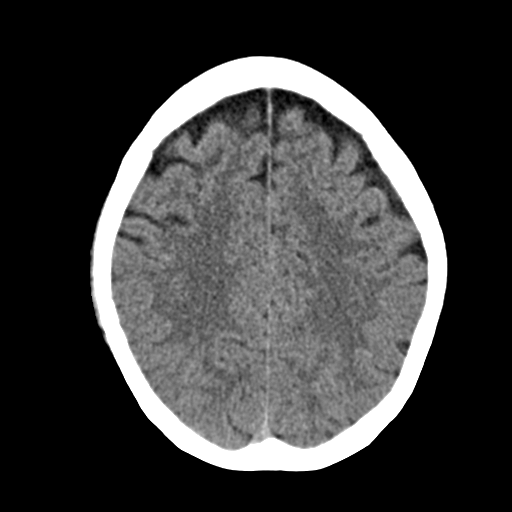
[im 23/32  brain]
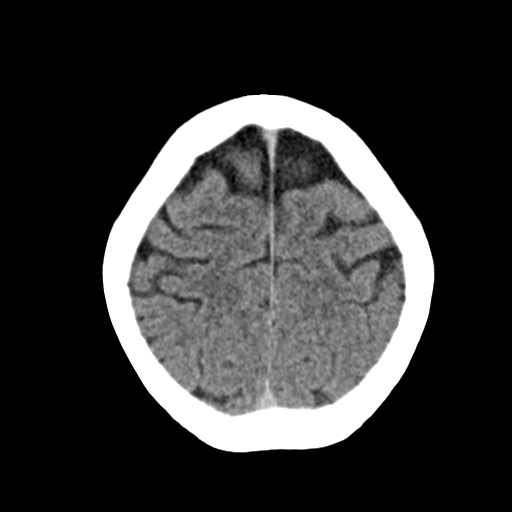
[im 26/32  brain]
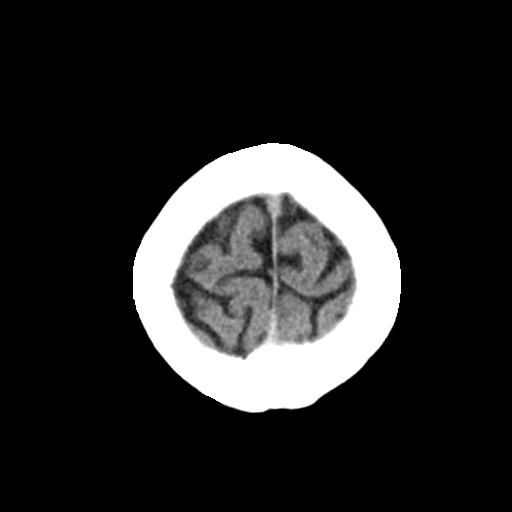
[im 29/32  brain]
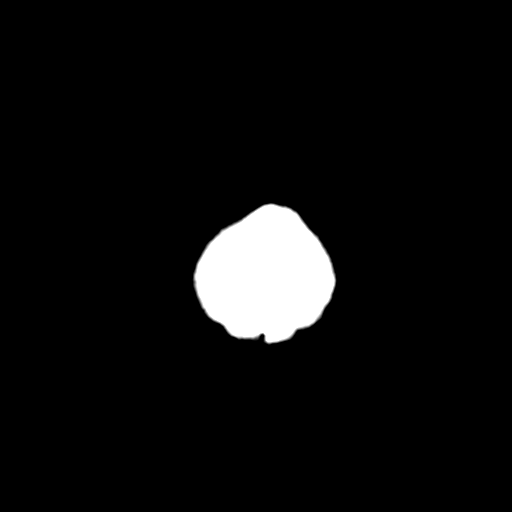
[im 29/32  bone]
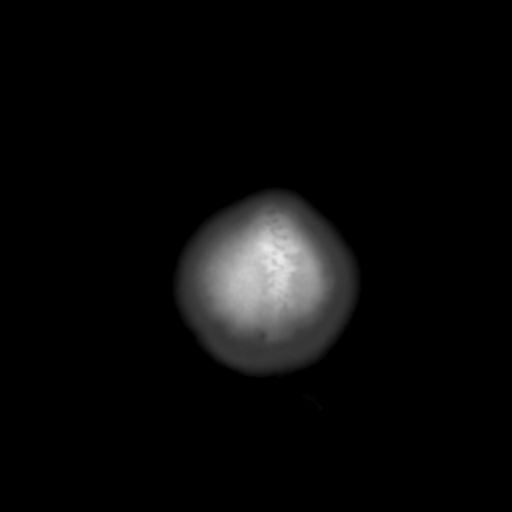

[Series 4: coronal soft · coronal · 0.31mm/px · 3 of 67 slices shown]
[im 23/67  brain]
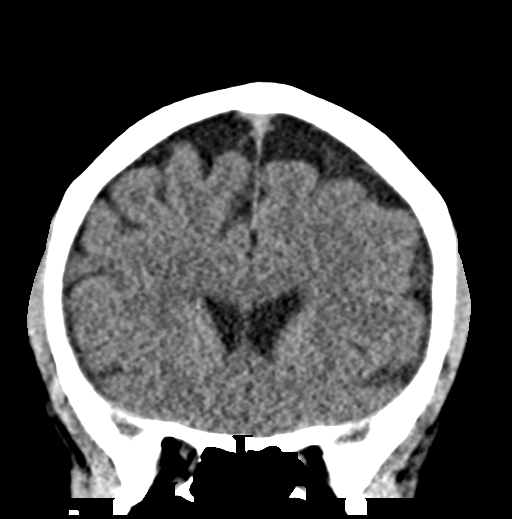
[im 30/67  brain]
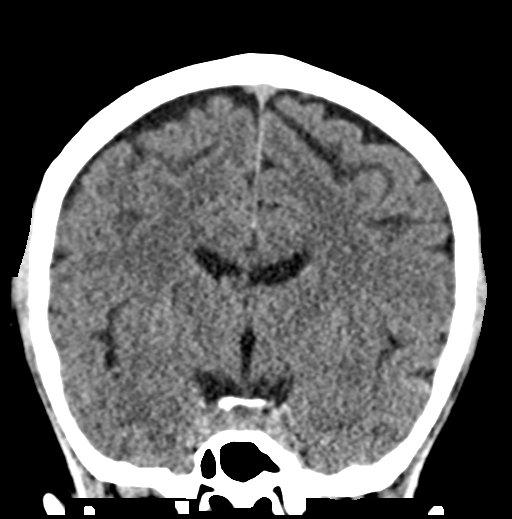
[im 37/67  brain]
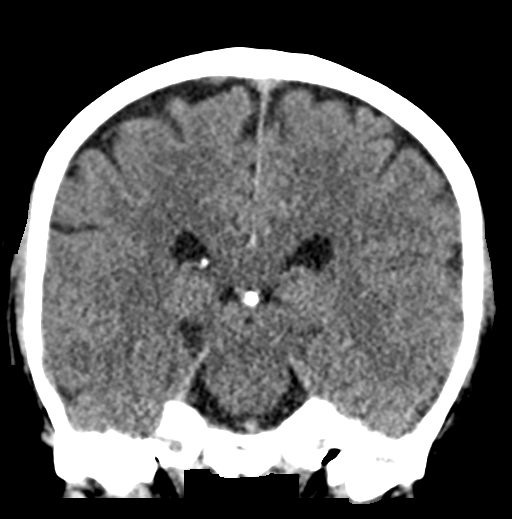

[Series 5: sag soft · sagittal · 0.31mm/px · 3 of 56 slices shown]
[im 19/56  brain]
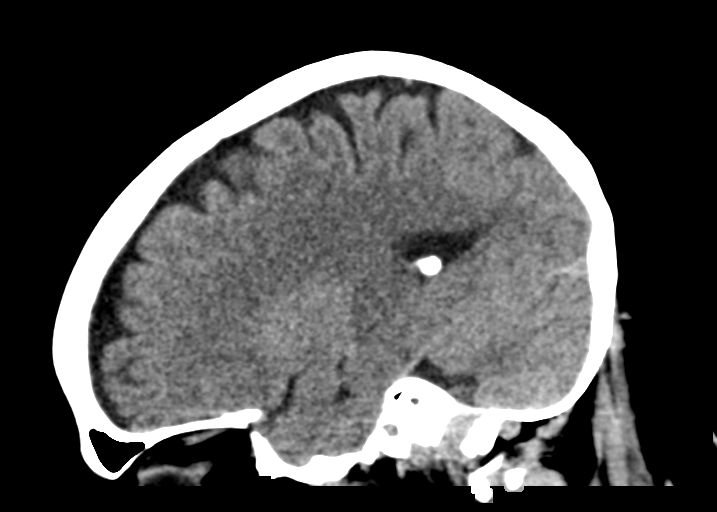
[im 28/56  brain]
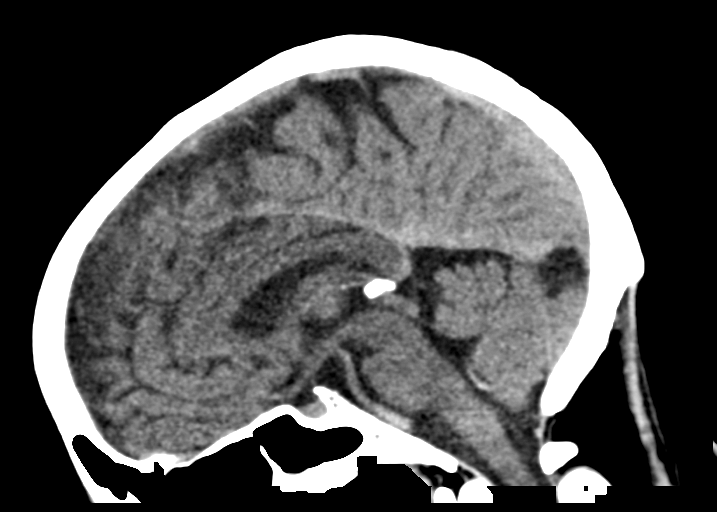
[im 37/56  brain]
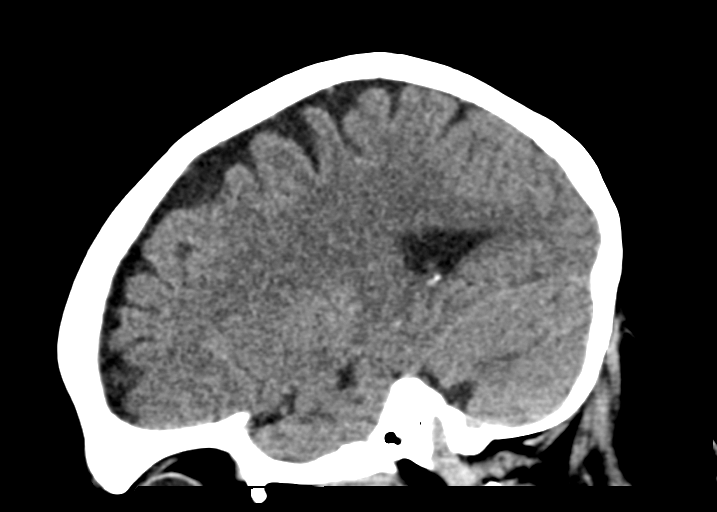

[15 of 47 positions shown; findings below may reference images not displayed]

FINDINGS: Brain: No midline shift, ventriculomegaly, mass effect, evidence of
mass lesion, intracranial hemorrhage or evidence of cortically based
acute infarction. Gray-white matter differentiation is within normal
limits throughout the brain.

Vascular: No suspicious intracranial vascular hyperdensity.

Skull: Negative aside from the left frontal bone along the roof of
the left orbit which demonstrates asymmetric thickening and
heterogeneous mineralization with a ground-glass type density
(series 3, image 6). This has a benign appearance, with no
aggressive features. Elsewhere bone mineralization in the calvarium
and skull base is normal.

Sinuses/Orbits: Mild bubbly opacity in the left sphenoid sinus.
Other visualized paranasal sinuses and mastoids are clear. The
visible tympanic cavities appear clear.

Other: Visualized orbits and scalp soft tissues are within normal
limits.
IMPRESSION: 1. Normal noncontrast CT appearance of the brain.
2. Left inferior frontal bone thickening and heterogeneity appears
benign and is most compatible with Fibrous Dysplasia. Clinical
significance is doubtful.
3. Mild inflammation in the left sphenoid sinus, but the remaining
paranasal sinuses appear clear.

## 2018-11-19 DIAGNOSIS — H5213 Myopia, bilateral: Secondary | ICD-10-CM | POA: Insufficient documentation

## 2019-04-22 ENCOUNTER — Telehealth: Payer: Self-pay | Admitting: *Deleted

## 2019-04-22 NOTE — Telephone Encounter (Signed)
Patient called back to reschedule appointment.

## 2019-04-22 NOTE — Telephone Encounter (Signed)
Returned call from 3:20pm about patient needing to reschedule appointment but no answer or voicemail to leave a message.

## 2019-05-01 ENCOUNTER — Ambulatory Visit: Payer: PRIVATE HEALTH INSURANCE | Admitting: Obstetrics and Gynecology

## 2019-05-03 ENCOUNTER — Ambulatory Visit: Payer: PRIVATE HEALTH INSURANCE | Admitting: Obstetrics & Gynecology

## 2019-05-20 ENCOUNTER — Ambulatory Visit (INDEPENDENT_AMBULATORY_CARE_PROVIDER_SITE_OTHER): Payer: PRIVATE HEALTH INSURANCE | Admitting: Obstetrics & Gynecology

## 2019-05-20 ENCOUNTER — Other Ambulatory Visit: Payer: Self-pay

## 2019-05-20 ENCOUNTER — Encounter: Payer: Self-pay | Admitting: Obstetrics & Gynecology

## 2019-05-20 VITALS — BP 130/79 | HR 70 | Ht 66.0 in | Wt 169.0 lb

## 2019-05-20 DIAGNOSIS — Z803 Family history of malignant neoplasm of breast: Secondary | ICD-10-CM | POA: Diagnosis not present

## 2019-05-20 DIAGNOSIS — R350 Frequency of micturition: Secondary | ICD-10-CM

## 2019-05-20 DIAGNOSIS — Z113 Encounter for screening for infections with a predominantly sexual mode of transmission: Secondary | ICD-10-CM | POA: Diagnosis not present

## 2019-05-20 DIAGNOSIS — Z01419 Encounter for gynecological examination (general) (routine) without abnormal findings: Secondary | ICD-10-CM | POA: Diagnosis not present

## 2019-05-20 DIAGNOSIS — N3942 Incontinence without sensory awareness: Secondary | ICD-10-CM

## 2019-05-20 DIAGNOSIS — N898 Other specified noninflammatory disorders of vagina: Secondary | ICD-10-CM | POA: Diagnosis not present

## 2019-05-20 NOTE — Progress Notes (Signed)
Last pap- 11/08/17-negative Last mammogram- 02/10/15- neagtive

## 2019-05-20 NOTE — Progress Notes (Signed)
Subjective:     [name] is a 58 y.o. female here for a routine exam.  Current complaints: having headache and dizziness today.  Pt having urinary incontinence with out sensory awareness for the past month.  Pt requesting STD testing for unprotected intercourse.   Gynecologic History Patient's last menstrual period was 01/15/2010. Contraception: post menopausal status Last Pap: 2019. Results were: normal Last mammogram: 2016. Results were: normal  Obstetric History OB History  Gravida Para Term Preterm AB Living  3 2     1     SAB TAB Ectopic Multiple Live Births  1            # Outcome Date GA Lbr Len/2nd Weight Sex Delivery Anes PTL Lv  3 SAB           2 Para           1 Para              The following portions of the patient's history were reviewed and updated as appropriate: allergies, current medications, past family history, past medical history, past social history, past surgical history and problem list.  Review of Systems Pertinent items noted in HPI and remainder of comprehensive ROS otherwise negative.    Objective:      Vitals:   05/20/19 1042  BP: 130/79  Pulse: 70  Weight: 169 lb (76.7 kg)  Height: 5\' 6"  (1.676 m)   Vitals:  WNL General appearance: alert, cooperative and no distress  HEENT: Normocephalic, without obvious abnormality, atraumatic Eyes: negative Throat: lips, mucosa, and tongue normal; teeth and gums normal  Respiratory: Clear to auscultation bilaterally  CV: Regular rate and rhythm  Breasts:  Normal appearance, no masses or tenderness, no nipple retraction or dimpling  GI: Soft, non-tender; bowel sounds normal; no masses,  no organomegaly  GU: External Genitalia:  Tanner V, no lesion Urethra:  No prolapse   Vagina: Pink, normal rugae, no blood or discharge  Cervix: No CMT, no lesion  Uterus:  Normal size and contour, non tender  Adnexa: Normal, no masses, non tender  Musculoskeletal: No edema, redness or tenderness in the  calves or thighs  Skin: No lesions or rash  Lymphatic: Axillary adenopathy: none     Psychiatric: Normal mood and behavior        Assessment:    Healthy female exam.   Urinary incontince Family history of colon and breast cancer   Plan:   Mammogram. Pap smear not indicated this year STD testing Referral to genetics to discuss complex family history  Referral to urology for incontinence without sensory awareness.   F/U with GI for colonoscopy as prescribed.

## 2019-05-21 ENCOUNTER — Telehealth: Payer: Self-pay | Admitting: Licensed Clinical Social Worker

## 2019-05-21 LAB — CERVICOVAGINAL ANCILLARY ONLY
Bacterial Vaginitis (gardnerella): NEGATIVE
Candida Glabrata: NEGATIVE
Candida Vaginitis: NEGATIVE
Chlamydia: NEGATIVE
Comment: NEGATIVE
Comment: NEGATIVE
Comment: NEGATIVE
Comment: NEGATIVE
Comment: NEGATIVE
Comment: NORMAL
Neisseria Gonorrhea: NEGATIVE
Trichomonas: NEGATIVE

## 2019-05-21 LAB — HIV ANTIBODY (ROUTINE TESTING W REFLEX): HIV 1&2 Ab, 4th Generation: NONREACTIVE

## 2019-05-21 LAB — RPR: RPR Ser Ql: NONREACTIVE

## 2019-05-21 NOTE — Telephone Encounter (Signed)
Received a genetic counseling referral from Dr. Gala Romney for fhx of breast cancer. [name] has been cld and scheduled to see Faith Rogue on 12/7 at 11am. Pt aware that a link will be sent to her phone for the video visit. Voiced understanding.

## 2019-05-22 LAB — URINE CULTURE
MICRO NUMBER:: 1146424
Result:: NO GROWTH
SPECIMEN QUALITY:: ADEQUATE

## 2019-05-22 LAB — HEPATITIS B SURFACE ANTIGEN: Hepatitis B Surface Ag: NONREACTIVE

## 2019-05-22 LAB — HEPATITIS C ANTIBODY
Hepatitis C Ab: NONREACTIVE
SIGNAL TO CUT-OFF: 0.01 (ref <1.00)

## 2019-05-24 ENCOUNTER — Telehealth: Payer: Self-pay | Admitting: Genetic Counselor

## 2019-05-24 NOTE — Telephone Encounter (Signed)
Contacted patient verify mychart video visit for pre reg

## 2019-05-27 ENCOUNTER — Inpatient Hospital Stay: Payer: PRIVATE HEALTH INSURANCE | Attending: Genetic Counselor | Admitting: Licensed Clinical Social Worker

## 2019-05-27 ENCOUNTER — Encounter: Payer: Self-pay | Admitting: Licensed Clinical Social Worker

## 2019-05-27 DIAGNOSIS — Z800 Family history of malignant neoplasm of digestive organs: Secondary | ICD-10-CM

## 2019-05-27 DIAGNOSIS — Z8042 Family history of malignant neoplasm of prostate: Secondary | ICD-10-CM | POA: Diagnosis not present

## 2019-05-27 DIAGNOSIS — Z808 Family history of malignant neoplasm of other organs or systems: Secondary | ICD-10-CM | POA: Insufficient documentation

## 2019-05-27 DIAGNOSIS — Z803 Family history of malignant neoplasm of breast: Secondary | ICD-10-CM | POA: Diagnosis not present

## 2019-05-27 NOTE — Progress Notes (Signed)
REFERRING PROVIDER: Guss Bunde, MD [place] [place],  [place] [address]  PRIMARY PROVIDER:  Idell Pickles, MD  PRIMARY REASON FOR VISIT:  1. Family history of colon cancer   2. Family history of breast cancer   3. Family history of prostate cancer   4. Family history of melanoma     I connected with [name] on 05/27/2019 at 11:00 AM EDT by MyChart video and verified that I am speaking with the correct person using two identifiers.    Patient location: home Provider location: clinic  HISTORY OF PRESENT ILLNESS:   [name], a 58 y.o. female, was seen for a [facility] cancer genetics consultation at the request of Dr. Gala Romney due to a family history of cancer.  [name] presents to clinic today to discuss the possibility of a hereditary predisposition to cancer, genetic testing, and to further clarify her future cancer risks, as well as potential cancer risks for family members.    [name] is a 58 y.o. female with no personal history of cancer.    CANCER HISTORY:  Oncology History   No history exists.     RISK FACTORS:  Menarche was at age 84.  First live birth at age 67.  OCP use for approximately 0 years.  Ovaries intact: yes.  Hysterectomy: no.  Menopausal status: postmenopausal.  HRT use: 0 years. Colonoscopy: yes; started at 8 and gets them every 3 yrs. Mammogram within the last year: yes. Number of breast biopsies: 1. Up to date with pelvic exams: yes. Any excessive radiation exposure in the past: no  Past Medical History:  Diagnosis Date  . ADD (attention deficit disorder)   . Bipolar disorder ([place])   . Depression   . Elevated testosterone level in female   . ETOH abuse   . Family history of breast cancer   . Family history of colon cancer   . Family history of melanoma   . Family history of prostate cancer   . Family history of throat cancer   . Lyme disease   . Menopause 02/10/2012  . Pernicious anemia   .  Sinus infection 02/10/2012   Pt reports this as a current problem    Past Surgical History:  Procedure Laterality Date  . CESAREAN SECTION    . CHOLECYSTECTOMY    . KNEE SURGERY     left  . LAPAROSCOPIC APPENDECTOMY      Social History   Socioeconomic History  . Marital status: Divorced    Spouse name: Not on file  . Number of children: Not on file  . Years of education: Not on file  . Highest education level: Not on file  Occupational History  . Not on file  Social Needs  . Financial resource strain: Not on file  . Food insecurity    Worry: Not on file    Inability: Not on file  . Transportation needs    Medical: Not on file    Non-medical: Not on file  Tobacco Use  . Smoking status: Former Smoker    Packs/day: 0.00    Years: 1.00    Pack years: 0.00    Types: Cigarettes  . Smokeless tobacco: Never Used  . Tobacco comment: The patient reports less than 1 cigarette a day at most and rarely 10 cigarettes a day.   Substance and Sexual Activity  . Alcohol use: Yes    Comment: Recovering alcoholic  . Drug use: No  Comment: Caffeine: Coffee 3-4 cups per day.   Marland Kitchen Sexual activity: Yes    Partners: Male    Birth control/protection: Condom    Comment: pituary gland problems, cannot get pregnant per her MD  Lifestyle  . Physical activity    Days per week: Not on file    Minutes per session: Not on file  . Stress: Not on file  Relationships  . Social Herbalist on phone: Not on file    Gets together: Not on file    Attends religious service: Not on file    Active member of club or organization: Not on file    Attends meetings of clubs or organizations: Not on file    Relationship status: Not on file  Other Topics Concern  . Not on file  Social History Narrative  . Not on file     FAMILY HISTORY:  We obtained a detailed, 4-generation family history.  Significant diagnoses are listed below: Family History  Problem Relation Age of Onset  . Prostate  cancer Father 39  . Melanoma Father        several removed over last 50 yrs  . Breast cancer Sister 22  . Alcohol abuse Maternal Uncle   . Cancer Maternal Uncle        Colon-dx 99  . Throat cancer Maternal Uncle   . Alcohol abuse Maternal Grandfather   . Cancer Maternal Grandfather        colon- dx 42 d. 73  . Depression Son   . Throat cancer Maternal Grandmother   . Breast cancer Paternal Grandmother 52  . Heart disease Neg Hx   . Hyperlipidemia Neg Hx   . Hypertension Neg Hx   . Stroke Neg Hx    [name] has 2 sons, ages 45 and 9 with no cancer history. Patient has 1 sister who had breast cancer around age 11 and is living at 24. Patient believes her sister had genetic testing.  [name] mother is living at 35 with no history of cancer. Patient has 1 maternal uncle. He had colon cancer in his 55s and throat cancer after his colon cancer, he is living at 31. Patient has 1 maternal cousin, no cancer history. Her maternal grandmother  Had throat cancer around age 30. Maternal grandfather had colon cancer at 52 and died at 70.  [name] father was diagnosed with prostate cancer about 10 years ago in his 85s and has also had several melanomas removed. Patient has 1 paternal aunt, no cancers. No cancers in paternal cousins. Paternal grandmother had breast cancer at 23 and died at 60. Paternal grandfather had dementia, no cancers.  [name] is aware of previous family history of genetic testing for hereditary cancer risks. Patient's maternal ancestors are of Scottish/English descent, and paternal ancestors are of Scottish/English descent. There is no reported Ashkenazi Jewish ancestry. There is no known consanguinity.  GENETIC COUNSELING ASSESSMENT: [name] is a 58 y.o. female with a family history which is somewhat suggestive of a hereditary cancer syndrome and predisposition to cancer. We, therefore, discussed and recommended the following at today's visit.    DISCUSSION: We discussed that 5 - 10% of breast cancer is hereditary, with most cases associated with BRCA1/BRCA2 mutations.  There are other genes that can be associated with hereditary cancer syndromes.  We briefly discussed Lynch syndrome given her family history of colon cancer.   We discussed that testing is beneficial for several reasons including  knowing how to follow individuals for cancer screenings and understand if other family members could be at risk for cancer and allow them to undergo genetic testing.   We reviewed the characteristics, features and inheritance patterns of hereditary cancer syndromes. We also discussed genetic testing, including the appropriate family members to test, the process of testing, insurance coverage and turn-around-time for results. We discussed the implications of a negative, positive and/or variant of uncertain significant result. We recommended [name] pursue genetic testing for the [facility] Common Hereditary Cancers Panel + Melanoma gene panel.   The Common Hereditary Cancers Panel offered by [facility] includes sequencing and/or deletion duplication testing of the following 48 genes: APC, ATM, AXIN2, BARD1, BMPR1A, BRCA1, BRCA2, BRIP1, CDH1, CDKN2A (p14ARF), CDKN2A (p16INK4a), CKD4, CHEK2, CTNNA1, DICER1, EPCAM (Deletion/duplication testing only), GREM1 (promoter region deletion/duplication testing only), KIT, MEN1, MLH1, MSH2, MSH3, MSH6, MUTYH, NBN, NF1, NHTL1, PALB2, PDGFRA, PMS2, POLD1, POLE, PTEN, RAD50, RAD51C, RAD51D, RNF43, SDHB, SDHC, SDHD, SMAD4, SMARCA4. STK11, TP53, TSC1, TSC2, and VHL.  The following genes were evaluated for sequence changes only: SDHA and HOXB13 c.251G>A variant only.  The [facility] Melanoma Panel analyzed the following 9 genes: BAP1 BRCA2 CDK4 CDKN2A MITF POT1 PTEN RB1 Tp53.  Based on [name] family history of cancer, she meets medical criteria for genetic testing. Despite that she meets criteria, she may still have  an out of pocket cost.  PLAN: After considering the risks, benefits, and limitations, [name] provided informed consent to pursue genetic testing. A saliva kit will be mailed to her home and the blood sample will be sent to [facility] for analysis of the Common Hereditary Cancers Panel + Melanoma Panel. Results should be available within approximately 2-3 weeks' time, at which point they will be disclosed by telephone to [name], as will any additional recommendations warranted by these results. [name] will receive a summary of her genetic counseling visit and a copy of her results once available. This information will also be available in Epic.   [name] questions were answered to her satisfaction today. Our contact information was provided should additional questions or concerns arise. Thank you for the referral and allowing [place] to share in the care of your patient.   Faith Rogue, MS, [facility] Genetic Counselor [email or web address] Phone: [phone]  The patient was seen for a total of 35 minutes in virtual genetic counseling.  Drs. Magrinat, Lindi Adie and/or Burr Medico were available for discussion regarding this case.   _______________________________________________________________________ For Office Staff:  Number of people involved in session: 1 Was an Intern/ student involved with case: no

## 2019-06-18 ENCOUNTER — Ambulatory Visit: Payer: Self-pay | Admitting: Licensed Clinical Social Worker

## 2019-06-18 ENCOUNTER — Encounter: Payer: Self-pay | Admitting: Licensed Clinical Social Worker

## 2019-06-18 ENCOUNTER — Telehealth: Payer: Self-pay | Admitting: Licensed Clinical Social Worker

## 2019-06-18 DIAGNOSIS — Z808 Family history of malignant neoplasm of other organs or systems: Secondary | ICD-10-CM

## 2019-06-18 DIAGNOSIS — Z8042 Family history of malignant neoplasm of prostate: Secondary | ICD-10-CM

## 2019-06-18 DIAGNOSIS — Z803 Family history of malignant neoplasm of breast: Secondary | ICD-10-CM

## 2019-06-18 DIAGNOSIS — Z1379 Encounter for other screening for genetic and chromosomal anomalies: Secondary | ICD-10-CM

## 2019-06-18 NOTE — Telephone Encounter (Signed)
Revealed negative genetic testing.  This normal result is reassuring.  It is unlikely that there is an increased risk of cancer due to a mutation in one of these genes.  However, genetic testing is not perfect, and cannot definitively rule out a hereditary cause.  It will be important for her to keep in contact with genetics to learn if any additional testing may be needed in the future.      

## 2019-06-18 NOTE — Progress Notes (Signed)
HPI:  Ms. Perham was previously seen in the [facility] clinic due to a family history of cancer and concerns regarding a hereditary predisposition to cancer. Please refer to our prior cancer genetics clinic note for more information regarding our discussion, assessment and recommendations, at the time. Ms. Magner recent genetic test results were disclosed to her, as were recommendations warranted by these results. These results and recommendations are discussed in more detail below.  CANCER HISTORY:  Oncology History   No history exists.    FAMILY HISTORY:  We obtained a detailed, 4-generation family history.  Significant diagnoses are listed below: Family History  Problem Relation Age of Onset  . Prostate cancer Father 6  . Melanoma Father        several removed over last 79 yrs  . Breast cancer Sister 74  . Alcohol abuse Maternal Uncle   . Cancer Maternal Uncle        Colon-dx 47  . Throat cancer Maternal Uncle   . Alcohol abuse Maternal Grandfather   . Cancer Maternal Grandfather        colon- dx 29 d. 54  . Depression Son   . Throat cancer Maternal Grandmother   . Breast cancer Paternal Grandmother 37  . Heart disease Neg Hx   . Hyperlipidemia Neg Hx   . Hypertension Neg Hx   . Stroke Neg Hx    Ms. Gerard has 2 sons, ages 76 and 38 with no cancer history. Patient has 1 sister who had breast cancer around age 70 and is living at 76. Patient believes her sister had genetic testing.  Ms. Doyle mother is living at 6 with no history of cancer. Patient has 1 maternal uncle. He had colon cancer in his 18s and throat cancer after his colon cancer, he is living at 56. Patient has 1 maternal cousin, no cancer history. Her maternal grandmother  Had throat cancer around age 5. Maternal grandfather had colon cancer at 59 and died at 20.  Ms. Zavadil father was diagnosed with prostate cancer about 10 years ago in his 42s and has also had several  melanomas removed. Patient has 1 paternal aunt, no cancers. No cancers in paternal cousins. Paternal grandmother had breast cancer at 63 and died at 42. Paternal grandfather had dementia, no cancers.  Ms. Quijas is aware of previous family history of genetic testing for hereditary cancer risks. Patient's maternal ancestors are of Scottish/English descent, and paternal ancestors are of Scottish/English descent. There is no reported Ashkenazi Jewish ancestry. There is no known consanguinity.   GENETIC TEST RESULTS: Genetic testing reported out on 06/17/2019 through the [facility] Common Hereditary Cancers Panel + Melanoma cancer panel found no pathogenic mutations. The Common Hereditary Cancers Panel offered by [facility] includes sequencing and/or deletion duplication testing of the following 48 genes: APC, ATM, AXIN2, BARD1, BMPR1A, BRCA1, BRCA2, BRIP1, CDH1, CDKN2A (p14ARF), CDKN2A (p16INK4a), CKD4, CHEK2, CTNNA1, DICER1, EPCAM (Deletion/duplication testing only), GREM1 (promoter region deletion/duplication testing only), KIT, MEN1, MLH1, MSH2, MSH3, MSH6, MUTYH, NBN, NF1, NHTL1, PALB2, PDGFRA, PMS2, POLD1, POLE, PTEN, RAD50, RAD51C, RAD51D, RNF43, SDHB, SDHC, SDHD, SMAD4, SMARCA4. STK11, TP53, TSC1, TSC2, and VHL.  The following genes were evaluated for sequence changes only: SDHA and HOXB13 c.251G>A variant only. The [facility] Melanoma Panel analyzed the following 9 genes: BAP1 BRCA2 CDK4 CDKN2A MITF POT1 PTEN RB1 Tp53. The test report has been scanned into EPIC and is located under the Molecular Pathology section of the Results Review tab.  A portion  of the result report is included below for reference.     We discussed with Ms. Phegley that because current genetic testing is not perfect, it is possible there may be a gene mutation in one of these genes that current testing cannot detect, but that chance is small.  We also discussed, that there could be another gene that has not yet been discovered, or  that we have not yet tested, that is responsible for the cancer diagnoses in the family. It is also possible there is a hereditary cause for the cancer in the family that Ms. Sciara did not inherit and therefore was not identified in her testing.  Therefore, it is important to remain in touch with cancer genetics in the future so that we can continue to offer Ms. Burston the most up to date genetic testing.   ADDITIONAL GENETIC TESTING: We discussed with Ms. Palazzo that her genetic testing was fairly extensive.  If there are genes identified to increase cancer risk that can be analyzed in the future, we would be happy to discuss and coordinate this testing at that time.    CANCER SCREENING RECOMMENDATIONS: Ms. Dunkleberger test result is considered negative (normal).  This means that we have not identified a hereditary cause for her family history of cancer at this time.   While reassuring, this does not definitively rule out a hereditary predisposition to cancer. It is still possible that there could be genetic mutations that are undetectable by current technology. There could be genetic mutations in genes that have not been tested or identified to increase cancer risk.  Therefore, it is recommended she continue to follow the cancer management and screening guidelines provided by her  primary healthcare provider.   An individual's cancer risk and medical management are not determined by genetic test results alone. Overall cancer risk assessment incorporates additional factors, including personal medical history, family history, and any available genetic information that may result in a personalized plan for cancer prevention and surveillance.  Based on Ms. Coia family history of cancer, as well as her genetic test results, risk model Harriett Rush was used to estimate her risk of developing breast cancer. This estimates  her lifetime risk of developing breast cancer to be approximately  22.2%. The patient's lifetime breast cancer risk is a preliminary estimate based on available information using one of several models endorsed by the [place] (ACS). The ACS recommends consideration of breast MRI screening as an adjunct to mammography for patients at high risk (defined as 20% or greater lifetime risk).    Ms. Hayashi has been determined to be at high risk for breast cancer.  Therefore, we recommend that annual screening with mammography and breast MRI.  We discussed that Ms. Wiswell should discuss her individual situation with her referring physician and determine a breast cancer screening plan with which they are both comfortable. We also offered a referral to our high risk clinic, Ms. Nelon preferred to discuss with Dr. Gala Romney first. Additionally, density C was used in calculating this risk, which was estimated based on the patient reported and on her 2016 mammogram. This estimate could change should her density be different, or if her sister had positive genetic testing.  RECOMMENDATIONS FOR FAMILY MEMBERS:  Relatives in this family might be at some increased risk of developing cancer, over the general population risk, simply due to the family history of cancer.  We recommended female relatives in this family have a yearly mammogram beginning at age  12, or 10 years younger than the earliest onset of cancer, an annual clinical breast exam, and perform monthly breast self-exams. Female relatives in this family should also have a gynecological exam as recommended by their primary provider. All family members should have a colonoscopy by age 23, or as directed by their physicians.   It is also possible there is a hereditary cause for the cancer in Ms. Cusic family that she did not inherit and therefore was not identified in her.  Based on Ms. Haggard family history, we recommended maternal relatives and her sister if she has not had testing yet have  genetic counseling and testing. Ms. Margerum will let us know if we can be of any assistance in coordinating genetic counseling and/or testing for these family members.  FOLLOW-UP: Lastly, we discussed with Ms. Chrisley that cancer genetics is a rapidly advancing field and it is possible that new genetic tests will be appropriate for her and/or her family members in the future. We encouraged her to remain in contact with cancer genetics on an annual basis so we can update her personal and family histories and let her know of advances in cancer genetics that may benefit this family.   Our contact number was provided. Ms. Nestle questions were answered to her satisfaction, and she knows she is welcome to call us at anytime with additional questions or concerns.   Faith Rogue, MS, [facility] Genetic Counselor [email or web address] Phone: [phone]

## 2019-09-25 ENCOUNTER — Other Ambulatory Visit (HOSPITAL_COMMUNITY)
Admission: RE | Admit: 2019-09-25 | Discharge: 2019-09-25 | Disposition: A | Discharge status: Home / Self Care | Payer: Medicare (Managed Care) | Source: Ambulatory Visit | Attending: Obstetrics and Gynecology | Admitting: Obstetrics and Gynecology

## 2019-09-25 ENCOUNTER — Encounter: Payer: Self-pay | Admitting: Obstetrics and Gynecology

## 2019-09-25 ENCOUNTER — Ambulatory Visit: Payer: PRIVATE HEALTH INSURANCE | Admitting: Obstetrics and Gynecology

## 2019-09-25 ENCOUNTER — Other Ambulatory Visit: Payer: Self-pay

## 2019-09-25 VITALS — BP 113/77 | HR 80 | Temp 98.3°F | Resp 16 | Ht 66.0 in | Wt 161.0 lb

## 2019-09-25 DIAGNOSIS — N898 Other specified noninflammatory disorders of vagina: Secondary | ICD-10-CM | POA: Insufficient documentation

## 2019-09-25 NOTE — Progress Notes (Signed)
GYNECOLOGY ENCOUNTER NOTE  History:     [name] is a 59 y.o. G71P0010 female here with complaints of new onset sticky discharge and irritation. The symptoms started about 1 month ago, she initially thought it was a bladder infection. She took antiotics prescribed by PCP and had a negative urine culture. The symptoms continued so she bought over the counter yeast treatment which also did not help. She called her PCP recently and was prescribed diflucan which she took. She has no odor. She does recall having intercourse prior to all of the symptoms which felt dry and was a bit more painful. She did not use lubrication. She is postmenopausal.  No new sexual partners.    Gynecologic History Patient's last menstrual period was 01/15/2010. Last Pap: 2019. Results were: normal with negative HPV   Obstetric History OB History  Gravida Para Term Preterm AB Living  3 2     1     SAB TAB Ectopic Multiple Live Births  1            # Outcome Date GA Lbr Len/2nd Weight Sex Delivery Anes PTL Lv  3 SAB           2 Para           1 Para             Past Medical History:  Diagnosis Date  . ADD (attention deficit disorder)   . Bipolar disorder ([place])   . Depression   . Elevated testosterone level in female   . ETOH abuse   . Family history of breast cancer   . Family history of colon cancer   . Family history of melanoma   . Family history of prostate cancer   . Family history of throat cancer   . Lyme disease   . Menopause 02/10/2012  . Pernicious anemia   . Sinus infection 02/10/2012   Pt reports this as a current problem    Past Surgical History:  Procedure Laterality Date  . CESAREAN SECTION    . CHOLECYSTECTOMY    . KNEE SURGERY     left  . LAPAROSCOPIC APPENDECTOMY      Current Outpatient Medications on File Prior to Visit  Medication Sig Dispense Refill  . bimatoprost (LATISSE) 0.03 % ophthalmic solution Apply one drop via applicator to eyelashes nightly at  bedtime    . buPROPion (WELLBUTRIN XL) 150 MG 24 hr tablet Take 1 tablet (150 mg total) by mouth daily. 30 tablet 0  . cetirizine (ZYRTEC) 5 MG tablet Take by mouth.    . cyanocobalamin (,VITAMIN B-12,) 1000 MCG/ML injection INJECT 1 ML INTO MUSCLE EVERY 14 DAYS 30 mL 0  . lamoTRIgine (LAMICTAL) 100 MG tablet Take one tablet a day (Patient taking differently: 400 mg. Take one tablet a day) 30 tablet 0  . meclizine (ANTIVERT) 25 MG tablet meclizine 25 mg tablet    . ondansetron (ZOFRAN-ODT) 4 MG disintegrating tablet ondansetron 4 mg disintegrating tablet    . ziprasidone (GEODON) 60 MG capsule TK ONE C PO QPM. TAKE WITH 350 CALORIES OF FOOD  0  . cholecalciferol (VITAMIN D) 1000 UNITS tablet Take by mouth daily. For bone health    . loratadine (CLARITIN) 10 MG tablet Take 10 mg by mouth daily.    . mupirocin ointment (BACTROBAN) 2 % APP SML AMT EXT AA TID  1  . omeprazole (PRILOSEC) 40 MG capsule Take 40 mg by mouth daily.    Marland Kitchen  ziprasidone (GEODON) 20 MG capsule TK ONE C PO QAM. TK WITH 350 CALORIES OF FOOD.  0   No current facility-administered medications on file prior to visit.    Allergies  Allergen Reactions  . Penicillins Anaphylaxis    Other reaction(s): Anaphylaxis Other reaction(s): Anaphylaxis Other reaction(s): Anaphylaxis   . Promethazine Other (See Comments)    Arms and legs start to moves Arms and legs start to moves Arms and legs start to moves Arms and legs start to moves Other reaction(s): Other (See Comments) Arms and legs start to moves Arms and legs start to moves Other reaction(s): Other (See Comments) Arms and legs start to moves Arms and legs start to moves  Other reaction(s): Other (See Comments) Arms and legs start to moves Arms and legs start to moves Other reaction(s): Other (See Comments) Arms and legs start to moves Arms and legs start to moves   . Flu Virus Vaccine Other (See Comments)    "severe flu symptoms" "severe flu symptoms" Other  reaction(s): Other (See Comments) "severe flu symptoms"   . Phenergan  [Promethazine Hcl]     Social History:  reports that she has quit smoking. Her smoking use included cigarettes. She smoked 0.00 packs per day for 1.00 year. She has never used smokeless tobacco. She reports current alcohol use. She reports that she does not use drugs.  Family History  Problem Relation Age of Onset  . Prostate cancer Father 56  . Melanoma Father        several removed over last 10 yrs  . Breast cancer Sister 88  . Alcohol abuse Maternal Uncle   . Cancer Maternal Uncle        Colon-dx 65  . Throat cancer Maternal Uncle   . Alcohol abuse Maternal Grandfather   . Cancer Maternal Grandfather        colon- dx 49 d. 63  . Depression Son   . Throat cancer Maternal Grandmother   . Breast cancer Paternal Grandmother 43  . Heart disease Neg Hx   . Hyperlipidemia Neg Hx   . Hypertension Neg Hx   . Stroke Neg Hx     The following portions of the patient's history were reviewed and updated as appropriate: allergies, current medications, past family history, past medical history, past social history, past surgical history and problem list.  Review of Systems Pertinent items noted in HPI and remainder of comprehensive ROS otherwise negative.  Physical Exam:  BP 113/77   Pulse 80   Temp 98.3 F (36.8 C)   Resp 16   Ht 5\' 6"  (1.676 m)   Wt 161 lb (73 kg)   LMP 01/15/2010   BMI 25.99 kg/m  CONSTITUTIONAL: Well-developed, well-nourished female in no acute distress.  HENT:  Normocephalic, atraumatic, External right and left ear normal. Oropharynx is clear and moist SKIN: Skin is warm and dry.  NEUROLOGIC: Alert and oriented to person, place, and time. PSYCHIATRIC: Normal mood and affect.  PELVIC: Normal appearing external genitalia and urethral meatus; normal appearing vaginal mucosa and cervix.  No abnormal discharge noted; small- moderate amount of white discharge, no odor.  Normal uterine size, no  other palpable masses, no uterine or adnexal tenderness.  Performed in the presence of a chaperone.   Assessment and Plan:  1. Vaginal irritation  - Cervicovaginal ancillary only( [place]) - Urine Culture - may be vaginal trauma from vaginal dryness. Encouraged lubrication with intercourse. Will await cultures. No other treatment at this time.  Routine preventative health maintenance measures emphasized. Please refer to After Visit Summary for other counseling recommendations.     Jilliam Bellmore, Harolyn Rutherford, NP [facility] for [employer], [facility]

## 2019-09-26 LAB — CERVICOVAGINAL ANCILLARY ONLY
Bacterial Vaginitis (gardnerella): NEGATIVE
Candida Glabrata: NEGATIVE
Candida Vaginitis: NEGATIVE
Chlamydia: NEGATIVE
Comment: NEGATIVE
Comment: NEGATIVE
Comment: NEGATIVE
Comment: NEGATIVE
Comment: NEGATIVE
Comment: NORMAL
Neisseria Gonorrhea: NEGATIVE
Trichomonas: NEGATIVE

## 2019-09-26 LAB — URINE CULTURE
MICRO NUMBER:: 10336920
Result:: NO GROWTH
SPECIMEN QUALITY:: ADEQUATE

## 2019-10-10 ENCOUNTER — Other Ambulatory Visit: Payer: Self-pay | Admitting: Obstetrics and Gynecology

## 2019-10-10 MED ORDER — ESTRADIOL 0.1 MG/GM VA CREA: 1.0000 g | TOPICAL_CREAM | VAGINAL | 1 refills | Status: AC

## 2019-10-10 NOTE — Progress Notes (Signed)
Discussed patient with Dr. Debroah Loop. Will trial Estradiol 2x weekly X 8 weeks. Schedule f/u in 8 weeks.  Duane Lope, NP 10/10/2019 2:59 PM

## 2020-06-10 ENCOUNTER — Telehealth: Payer: Self-pay | Admitting: *Deleted

## 2020-06-10 NOTE — Telephone Encounter (Signed)
Returned call from 06/09/2020 at 4:38 PM. Patient did not answer and voicemail never picked up to leave a message.

## 2020-06-17 ENCOUNTER — Telehealth: Payer: Self-pay | Admitting: *Deleted

## 2020-06-17 MED ORDER — ESTRADIOL 0.1 MG/GM VA CREA: TOPICAL_CREAM | VAGINAL | 0 refills | Status: DC

## 2020-06-17 NOTE — Telephone Encounter (Signed)
Pt called requesting a RF on her Estrace vaginal cream.  She was given this RX 10/10/19 and was told to return in 8 weeks.  She never returned and is now 2 months over due  For annual.  1 RF given and appt made for her annual.

## 2020-07-01 ENCOUNTER — Ambulatory Visit: Payer: PRIVATE HEALTH INSURANCE | Admitting: Obstetrics and Gynecology

## 2020-07-16 ENCOUNTER — Other Ambulatory Visit: Payer: Self-pay

## 2020-07-16 ENCOUNTER — Encounter: Payer: Self-pay | Admitting: Obstetrics and Gynecology

## 2020-07-16 ENCOUNTER — Other Ambulatory Visit (HOSPITAL_COMMUNITY)
Admission: RE | Admit: 2020-07-16 | Discharge: 2020-07-16 | Disposition: A | Discharge status: Home / Self Care | Payer: Medicare (Managed Care) | Source: Ambulatory Visit | Attending: Obstetrics and Gynecology | Admitting: Obstetrics and Gynecology

## 2020-07-16 ENCOUNTER — Ambulatory Visit (INDEPENDENT_AMBULATORY_CARE_PROVIDER_SITE_OTHER): Payer: PRIVATE HEALTH INSURANCE | Admitting: Obstetrics and Gynecology

## 2020-07-16 VITALS — BP 103/66 | HR 78 | Ht 66.0 in | Wt 160.0 lb

## 2020-07-16 DIAGNOSIS — Z113 Encounter for screening for infections with a predominantly sexual mode of transmission: Secondary | ICD-10-CM | POA: Diagnosis not present

## 2020-07-16 DIAGNOSIS — Z1231 Encounter for screening mammogram for malignant neoplasm of breast: Secondary | ICD-10-CM

## 2020-07-16 DIAGNOSIS — Z124 Encounter for screening for malignant neoplasm of cervix: Secondary | ICD-10-CM | POA: Diagnosis present

## 2020-07-16 DIAGNOSIS — Z0000 Encounter for general adult medical examination without abnormal findings: Secondary | ICD-10-CM | POA: Diagnosis not present

## 2020-07-16 DIAGNOSIS — Z01419 Encounter for gynecological examination (general) (routine) without abnormal findings: Secondary | ICD-10-CM

## 2020-07-16 DIAGNOSIS — N898 Other specified noninflammatory disorders of vagina: Secondary | ICD-10-CM

## 2020-07-16 MED ORDER — ESTRADIOL 0.1 MG/GM VA CREA: TOPICAL_CREAM | VAGINAL | 2 refills | Status: AC

## 2020-07-16 NOTE — Progress Notes (Signed)
GYNECOLOGY ANNUAL PREVENTATIVE CARE ENCOUNTER NOTE  Subjective:   [name] is a 60 y.o. G1P0010 female here for a annual gynecologic exam. Current complaints: would like refill on estrace, has had good response to it. Also wants STI testing due to unprotected intercourse.    Denies abnormal vaginal bleeding, discharge, pelvic pain, problems with intercourse or other gynecologic concerns. Accepts STI screen.   Gynecologic History Patient's last menstrual period was 01/15/2010. Contraception: post menopausal status Last Pap: 10/2017. Results: normal Last mammogram: 05/2019. Results: [place] 1 DEXA: has never had  Obstetric History OB History  Gravida Para Term Preterm AB Living  3 2     1     SAB IAB Ectopic Multiple Live Births  1            # Outcome Date GA Lbr Len/2nd Weight Sex Delivery Anes PTL Lv  3 SAB           2 Para           1 Para             Past Medical History:  Diagnosis Date  . ADD (attention deficit disorder)   . Bipolar disorder ([facility])   . Depression   . Elevated testosterone level in female   . ETOH abuse   . Family history of breast cancer   . Family history of colon cancer   . Family history of melanoma   . Family history of prostate cancer   . Family history of throat cancer   . Lyme disease   . Menopause 02/10/2012  . Pernicious anemia   . Sinus infection 02/10/2012   Pt reports this as a current problem    Past Surgical History:  Procedure Laterality Date  . CESAREAN SECTION    . CHOLECYSTECTOMY    . KNEE SURGERY     left  . LAPAROSCOPIC APPENDECTOMY      Current Outpatient Medications on File Prior to Visit  Medication Sig Dispense Refill  . buPROPion (WELLBUTRIN XL) 150 MG 24 hr tablet Take 1 tablet (150 mg total) by mouth daily. 30 tablet 0  . cholecalciferol (VITAMIN D) 1000 UNITS tablet Take by mouth daily. For bone health    . cyanocobalamin (,VITAMIN B-12,) 1000 MCG/ML injection INJECT 1 ML INTO MUSCLE EVERY 14  DAYS 30 mL 0  . lamoTRIgine (LAMICTAL) 100 MG tablet Take one tablet a day (Patient taking differently: 400 mg. Take one tablet a day) 30 tablet 0  . loratadine (CLARITIN) 10 MG tablet Take 10 mg by mouth daily.    . meclizine (ANTIVERT) 25 MG tablet meclizine 25 mg tablet    . montelukast (SINGULAIR) 10 MG tablet Take by mouth.    02/12/2012 omeprazole (PRILOSEC) 40 MG capsule Take 40 mg by mouth daily.    . ondansetron (ZOFRAN-ODT) 4 MG disintegrating tablet ondansetron 4 mg disintegrating tablet    . ziprasidone (GEODON) 20 MG capsule 60 mg.  0  . ziprasidone (GEODON) 60 MG capsule TK ONE C PO QPM. TAKE WITH 350 CALORIES OF FOOD  0  . cetirizine (ZYRTEC) 5 MG tablet Take by mouth. (Patient not taking: Reported on 07/16/2020)    . mupirocin ointment (BACTROBAN) 2 % APP SML AMT EXT AA TID (Patient not taking: Reported on 07/16/2020)  1   No current facility-administered medications on file prior to visit.    Allergies  Allergen Reactions  . Penicillins Anaphylaxis    Other reaction(s): Anaphylaxis Other reaction(s): Anaphylaxis Other  reaction(s): Anaphylaxis   . Promethazine Other (See Comments)    Arms and legs start to moves Arms and legs start to moves Arms and legs start to moves Arms and legs start to moves Other reaction(s): Other (See Comments) Arms and legs start to moves Arms and legs start to moves Other reaction(s): Other (See Comments) Arms and legs start to moves Arms and legs start to moves  Other reaction(s): Other (See Comments) Arms and legs start to moves Arms and legs start to moves Other reaction(s): Other (See Comments) Arms and legs start to moves Arms and legs start to moves   . Influenza Virus Vaccine Other (See Comments)    "severe flu symptoms" "severe flu symptoms" Other reaction(s): Other (See Comments) "severe flu symptoms"   . Other     Other reaction(s): Other (See Comments) "severe flu symptoms" "severe flu symptoms" Other reaction(s): Other  (See Comments) "severe flu symptoms"  . Phenergan  [Promethazine Hcl]     Social History   Socioeconomic History  . Marital status: Divorced    Spouse name: Not on file  . Number of children: Not on file  . Years of education: Not on file  . Highest education level: Not on file  Occupational History  . Not on file  Tobacco Use  . Smoking status: Former Smoker    Packs/day: 0.00    Years: 1.00    Pack years: 0.00    Types: Cigarettes  . Smokeless tobacco: Never Used  . Tobacco comment: The patient reports less than 1 cigarette a day at most and rarely 10 cigarettes a day.   Substance and Sexual Activity  . Alcohol use: Yes    Comment: Recovering alcoholic  . Drug use: No    Comment: Caffeine: Coffee 3-4 cups per day.   Marland Kitchen Sexual activity: Yes    Partners: Male    Birth control/protection: Condom    Comment: pituary gland problems, cannot get pregnant per her MD  Other Topics Concern  . Not on file  Social History Narrative  . Not on file   Social Determinants of Health   Financial Resource Strain: Not on file  Food Insecurity: Not on file  Transportation Needs: Not on file  Physical Activity: Not on file  Stress: Not on file  Social Connections: Not on file  Intimate Partner Violence: Not on file    Family History  Problem Relation Age of Onset  . Prostate cancer Father 73  . Melanoma Father        several removed over last 10 yrs  . Breast cancer Sister 34  . Alcohol abuse Maternal Uncle   . Cancer Maternal Uncle        Colon-dx 65  . Throat cancer Maternal Uncle   . Alcohol abuse Maternal Grandfather   . Cancer Maternal Grandfather        colon- dx 49 d. 56  . Depression Son   . Throat cancer Maternal Grandmother   . Breast cancer Paternal Grandmother 95  . Heart disease Neg Hx   . Hyperlipidemia Neg Hx   . Hypertension Neg Hx   . Stroke Neg Hx     The following portions of the patient's history were reviewed and updated as appropriate: allergies,  current medications, past family history, past medical history, past social history, past surgical history and problem list.  Review of Systems Pertinent items are noted in HPI.   Objective:  BP 103/66   Pulse 78  Ht 5\' 6"  (1.676 m)   Wt 160 lb (72.6 kg)   LMP 01/15/2010   BMI 25.82 kg/m  CONSTITUTIONAL: Well-developed, well-nourished female in no acute distress.  HENT:  Normocephalic, atraumatic, External right and left ear normal. Oropharynx is clear and moist EYES: Conjunctivae and EOM are normal. Pupils are equal, round, and reactive to light. No scleral icterus.  NECK: Normal range of motion, supple, no masses.  Normal thyroid.  SKIN: Skin is warm and dry. No rash noted. Not diaphoretic. No erythema. No pallor. NEUROLOGIC: Alert and oriented to person, place, and time. Normal reflexes, muscle tone coordination. No cranial nerve deficit noted. PSYCHIATRIC: Normal mood and affect. Normal behavior. Normal judgment and thought content. CARDIOVASCULAR: Normal heart rate noted RESPIRATORY: Effort normal, no problems with respiration noted. BREASTS: Symmetric in size. No masses, skin changes, nipple drainage, or lymphadenopathy. ABDOMEN: Soft, no distention noted.  No tenderness, rebound or guarding.  PELVIC: Normal appearing external genitalia; normal appearing vaginal mucosa and cervix.  No abnormal discharge noted.  Pap smear obtained. Pelvic cultures obtained. Normal uterine size, no other palpable masses, no uterine or adnexal tenderness. MUSCULOSKELETAL: Normal range of motion. No tenderness.  No cyanosis, clubbing, or edema.  2+ distal pulses.  Exam done with chaperone present.   Assessment and Plan:   1. Routine screening for STI (sexually transmitted infection) - Cervicovaginal ancillary only - Hepatitis B surface antigen - Hepatitis C antibody - HIV Antibody (routine testing w rflx) - RPR  2. Well woman exam Healthy female exam  3. Cervical cancer screening -  Cytology - PAP( [place])  4. Encounter for screening mammogram for malignant neoplasm of breast Ordered today  5. Vaginal dryness Cont estrace prn May increase or decrease frequency as needed as she gets good relief with it  6. Vaginal discharge Swab today   Will follow up results of pap smear/STI screen and manage accordingly. Encouraged improvement in diet and exercise.  COVID vaccine - not given due to h/o flu vaccine allergy Accepts STI screen. Mammogram scheduled for next month Referral for colonoscopy UTD Flu vaccine declines DEXA not due based on age  Routine preventative health maintenance measures emphasized. Please refer to After Visit Summary for other counseling recommendations.    01/17/2010, MD, [facility] Attending Center for [facility] [place])

## 2020-07-16 NOTE — Progress Notes (Signed)
Last pap 11/08/17- negative

## 2020-07-17 LAB — CERVICOVAGINAL ANCILLARY ONLY
Bacterial Vaginitis (gardnerella): NEGATIVE
Candida Glabrata: NEGATIVE
Candida Vaginitis: POSITIVE — AB
Chlamydia: NEGATIVE
Comment: NEGATIVE
Comment: NEGATIVE
Comment: NEGATIVE
Comment: NEGATIVE
Comment: NEGATIVE
Comment: NORMAL
Neisseria Gonorrhea: NEGATIVE
Trichomonas: NEGATIVE

## 2020-07-17 LAB — HIV ANTIBODY (ROUTINE TESTING W REFLEX): HIV 1&2 Ab, 4th Generation: NONREACTIVE

## 2020-07-17 LAB — HEPATITIS B SURFACE ANTIGEN: Hepatitis B Surface Ag: NONREACTIVE

## 2020-07-17 LAB — RPR: RPR Ser Ql: NONREACTIVE

## 2020-07-17 LAB — HEPATITIS C ANTIBODY
Hepatitis C Ab: NONREACTIVE
SIGNAL TO CUT-OFF: 0.01 (ref <1.00)

## 2020-07-20 LAB — CYTOLOGY - PAP
Comment: NEGATIVE
Diagnosis: NEGATIVE
High risk HPV: NEGATIVE

## 2020-07-21 MED ORDER — FLUCONAZOLE 150 MG PO TABS: 150.0000 mg | ORAL_TABLET | Freq: Once | ORAL | 1 refills | Status: AC

## 2020-07-21 NOTE — Addendum Note (Signed)
Addended by: Leroy Libman on: 07/21/2020 08:28 AM   Modules accepted: Orders

## 2021-04-21 ENCOUNTER — Telehealth: Payer: Self-pay | Admitting: *Deleted

## 2021-04-21 NOTE — Telephone Encounter (Signed)
Returned call from 04/20/21 at 4:13 PM. No voicemail to leave a message that annual is not due until after 07/16/2021. Patient will be contacted once the office has a January/February schedule.

## 2021-07-30 ENCOUNTER — Other Ambulatory Visit (HOSPITAL_COMMUNITY)
Admission: RE | Admit: 2021-07-30 | Discharge: 2021-07-30 | Disposition: A | Discharge status: Home / Self Care | Payer: Medicare (Managed Care) | Source: Ambulatory Visit | Attending: Obstetrics and Gynecology | Admitting: Obstetrics and Gynecology

## 2021-07-30 ENCOUNTER — Encounter: Payer: Self-pay | Admitting: Obstetrics and Gynecology

## 2021-07-30 ENCOUNTER — Other Ambulatory Visit: Payer: Self-pay

## 2021-07-30 ENCOUNTER — Ambulatory Visit (INDEPENDENT_AMBULATORY_CARE_PROVIDER_SITE_OTHER): Payer: PRIVATE HEALTH INSURANCE | Admitting: Obstetrics and Gynecology

## 2021-07-30 VITALS — BP 114/76 | Resp 16 | Ht 66.0 in | Wt 161.0 lb

## 2021-07-30 DIAGNOSIS — N898 Other specified noninflammatory disorders of vagina: Secondary | ICD-10-CM | POA: Insufficient documentation

## 2021-07-30 DIAGNOSIS — Z01419 Encounter for gynecological examination (general) (routine) without abnormal findings: Secondary | ICD-10-CM

## 2021-07-30 DIAGNOSIS — Z113 Encounter for screening for infections with a predominantly sexual mode of transmission: Secondary | ICD-10-CM | POA: Diagnosis not present

## 2021-07-30 NOTE — Progress Notes (Signed)
GYNECOLOGY ANNUAL PREVENTATIVE CARE ENCOUNTER NOTE  History:     [name] is a 60 y.o. G40P0010 female here for a routine annual gynecologic exam.  Current complaints: none.    Denies abnormal vaginal bleeding, discharge, pelvic pain, problems with intercourse or other gynecologic concerns.  Has been using vaginal estrogen (estrace) and has had great improvement in vaginal dryness.   Gynecologic History Patient's last menstrual period was 01/15/2010. Contraception: none- Post menopausal  Last Pap: 2021,  Result was normal with negative HPV Last Mammogram: 08/2020  Result was normal   Obstetric History OB History  Gravida Para Term Preterm AB Living  3 2     1     SAB IAB Ectopic Multiple Live Births  1            # Outcome Date GA Lbr Len/2nd Weight Sex Delivery Anes PTL Lv  3 SAB           2 Para           1 Para             Past Medical History:  Diagnosis Date   ADD (attention deficit disorder)    Bipolar disorder ([facility])    Depression    Elevated testosterone level in female    ETOH abuse    Family history of breast cancer    Family history of colon cancer    Family history of melanoma    Family history of prostate cancer    Family history of throat cancer    Lyme disease    Menopause 02/10/2012   Pernicious anemia    Sinus infection 02/10/2012   Pt reports this as a current problem    Past Surgical History:  Procedure Laterality Date   CESAREAN SECTION     CHOLECYSTECTOMY     KNEE SURGERY     left   LAPAROSCOPIC APPENDECTOMY      Current Outpatient Medications on File Prior to Visit  Medication Sig Dispense Refill   ascorbic acid (VITAMIN C) 500 MG tablet Take by mouth.     buPROPion (WELLBUTRIN XL) 150 MG 24 hr tablet Take 1 tablet (150 mg total) by mouth daily. 30 tablet 0   cyanocobalamin (,VITAMIN B-12,) 1000 MCG/ML injection INJECT 1 ML INTO MUSCLE EVERY 14 DAYS 30 mL 0   doxycycline (VIBRAMYCIN) 100 MG capsule Take by mouth.      estradiol (ESTRACE VAGINAL) 0.1 MG/GM vaginal cream Apply vaginally BIW 42.5 g 2   fluticasone (FLONASE) 50 MCG/ACT nasal spray Place into the nose.     lamoTRIgine (LAMICTAL) 100 MG tablet Take one tablet a day (Patient taking differently: 400 mg. Take one tablet a day) 30 tablet 0   meclizine (ANTIVERT) 25 MG tablet meclizine 25 mg tablet     methylPREDNISolone (MEDROL DOSEPAK) 4 MG TBPK tablet See admin instructions.     ondansetron (ZOFRAN-ODT) 4 MG disintegrating tablet ondansetron 4 mg disintegrating tablet     SUMAtriptan (IMITREX) 25 MG tablet Take by mouth daily.     ziprasidone (GEODON) 60 MG capsule TK ONE C PO QPM. TAKE WITH 350 CALORIES OF FOOD  0   montelukast (SINGULAIR) 10 MG tablet Take by mouth.     No current facility-administered medications on file prior to visit.    Allergies  Allergen Reactions   Penicillins Anaphylaxis    Other reaction(s): Anaphylaxis Other reaction(s): Anaphylaxis Other reaction(s): Anaphylaxis    Promethazine Other (See Comments)    Arms  and legs start to moves Arms and legs start to moves Arms and legs start to moves Arms and legs start to moves Other reaction(s): Other (See Comments) Arms and legs start to moves Arms and legs start to moves Other reaction(s): Other (See Comments) Arms and legs start to moves Arms and legs start to moves  Other reaction(s): Other (See Comments) Arms and legs start to moves Arms and legs start to moves Other reaction(s): Other (See Comments) Arms and legs start to moves Arms and legs start to moves    Influenza Virus Vaccine Other (See Comments)    "severe flu symptoms" "severe flu symptoms" Other reaction(s): Other (See Comments) "severe flu symptoms"    Other     Other reaction(s): Other (See Comments) "severe flu symptoms" "severe flu symptoms" Other reaction(s): Other (See Comments) "severe flu symptoms"   Phenergan  [Promethazine Hcl]    Hemophilus B Polysaccharide Vaccine Rash     "severe flu symptoms" "severe flu symptoms" Other reaction(s): Other (See Comments) "severe flu symptoms"    Social History:  reports that she has quit smoking. Her smoking use included cigarettes. She has never used smokeless tobacco. She reports current alcohol use. She reports that she does not use drugs.  Family History  Problem Relation Age of Onset   Prostate cancer Father 44   Melanoma Father        several removed over last 15 yrs   Breast cancer Sister 63   Alcohol abuse Maternal Uncle    Cancer Maternal Uncle        Colon-dx 65   Throat cancer Maternal Uncle    Alcohol abuse Maternal Grandfather    Cancer Maternal Grandfather        colon- dx 71 d. 61   Depression Son    Throat cancer Maternal Grandmother    Breast cancer Paternal Grandmother 78   Heart disease Neg Hx    Hyperlipidemia Neg Hx    Hypertension Neg Hx    Stroke Neg Hx     The following portions of the patient's history were reviewed and updated as appropriate: allergies, current medications, past family history, past medical history, past social history, past surgical history and problem list.  Review of Systems Pertinent items noted in HPI and remainder of comprehensive ROS otherwise negative.  Physical Exam:  BP 114/76    Resp 16    Ht 5\' 6"  (1.676 m)    Wt 161 lb (73 kg)    LMP 01/15/2010    BMI 25.99 kg/m  CONSTITUTIONAL: Well-developed, well-nourished female in no acute distress.  HENT:  Normocephalic, atraumatic, External right and left ear normal.  EYES: Conjunctivae and EOM are normal. Pupils are equal, round, and reactive to light. No scleral icterus.  NECK: Normal range of motion, supple, no masses.  Normal thyroid.  SKIN: Skin is warm and dry. No rash noted. Not diaphoretic. No erythema. No pallor. MUSCULOSKELETAL: Normal range of motion. No tenderness.  No cyanosis, clubbing, or edema. NEUROLOGIC: Alert and oriented to person, place, and time. Normal reflexes, muscle tone coordination.   PSYCHIATRIC: Normal mood and affect. Normal behavior. Normal judgment and thought content. CARDIOVASCULAR: Normal heart rate noted, regular rhythm RESPIRATORY: Clear to auscultation bilaterally. Effort and breath sounds normal, no problems with respiration noted. BREASTS: Symmetric in size. No masses, tenderness, skin changes, nipple drainage, or lymphadenopathy bilaterally. Performed in the presence of a chaperone. ABDOMEN: Soft, no distention noted.  No tenderness, rebound or guarding.  PELVIC: Normal appearing  external genitalia and urethral meatus; normal appearing vaginal mucosa and cervix.  No abnormal vaginal discharge noted.  Pap smear not obtained.  Normal uterine size, no other palpable masses, no uterine or adnexal tenderness.  Performed in the presence of a chaperone.   Assessment and Plan:   1. Women's annual routine gynecological examination  - HIV antibody (with reflex) - RPR - Hepatitis C Antibody - MM Digital Screening; Future  2. Vaginal itching  - Cervicovaginal ancillary only( [place])   Mammogram ordered- last Mammogram in 2022- normal  Routine preventative health maintenance measures emphasized. Please refer to After Visit Summary for other counseling recommendations.     Kataleya Zaugg, Artist Pais, [place] for [employer], [place]

## 2021-07-30 NOTE — Progress Notes (Signed)
Last mammogram scheduled in March

## 2021-08-02 LAB — RPR: RPR Ser Ql: NONREACTIVE

## 2021-08-02 LAB — HIV ANTIBODY (ROUTINE TESTING W REFLEX): HIV 1&2 Ab, 4th Generation: NONREACTIVE

## 2021-08-02 LAB — HEPATITIS C ANTIBODY
Hepatitis C Ab: NONREACTIVE
SIGNAL TO CUT-OFF: 0.02 (ref <1.00)

## 2021-08-03 LAB — CERVICOVAGINAL ANCILLARY ONLY
Bacterial Vaginitis (gardnerella): POSITIVE — AB
Candida Glabrata: NEGATIVE
Candida Vaginitis: NEGATIVE
Chlamydia: NEGATIVE
Comment: NEGATIVE
Comment: NEGATIVE
Comment: NEGATIVE
Comment: NEGATIVE
Comment: NEGATIVE
Comment: NORMAL
Neisseria Gonorrhea: NEGATIVE
Trichomonas: NEGATIVE

## 2021-08-04 ENCOUNTER — Telehealth: Payer: Self-pay | Admitting: *Deleted

## 2021-08-04 MED ORDER — METRONIDAZOLE 500 MG PO TABS: 500.0000 mg | ORAL_TABLET | Freq: Two times a day (BID) | ORAL | 0 refills | Status: DC

## 2021-08-04 NOTE — Telephone Encounter (Cosign Needed)
Pt notified of positive for BV and per protocol Falgyl 500 mg sent to [facility] in [facility]

## 2021-10-11 ENCOUNTER — Ambulatory Visit (INDEPENDENT_AMBULATORY_CARE_PROVIDER_SITE_OTHER): Payer: PRIVATE HEALTH INSURANCE

## 2021-10-11 ENCOUNTER — Other Ambulatory Visit (HOSPITAL_COMMUNITY)
Admission: RE | Admit: 2021-10-11 | Discharge: 2021-10-11 | Disposition: A | Discharge status: Home / Self Care | Payer: Medicare (Managed Care) | Source: Ambulatory Visit | Attending: Obstetrics & Gynecology | Admitting: Obstetrics & Gynecology

## 2021-10-11 DIAGNOSIS — B379 Candidiasis, unspecified: Secondary | ICD-10-CM | POA: Insufficient documentation

## 2021-10-11 DIAGNOSIS — N760 Acute vaginitis: Secondary | ICD-10-CM | POA: Insufficient documentation

## 2021-10-11 DIAGNOSIS — B9689 Other specified bacterial agents as the cause of diseases classified elsewhere: Secondary | ICD-10-CM

## 2021-10-11 NOTE — Progress Notes (Cosign Needed)
Pt here for a self swab only.  She had BV in Feb and wants to make sure that it is gone.  She did take her med as prescribed. ?

## 2021-10-12 LAB — CERVICOVAGINAL ANCILLARY ONLY
Bacterial Vaginitis (gardnerella): NEGATIVE
Candida Glabrata: NEGATIVE
Candida Vaginitis: NEGATIVE
Comment: NEGATIVE
Comment: NEGATIVE
Comment: NEGATIVE

## 2023-02-08 ENCOUNTER — Encounter: Payer: Self-pay | Admitting: Obstetrics and Gynecology

## 2023-02-08 ENCOUNTER — Ambulatory Visit (INDEPENDENT_AMBULATORY_CARE_PROVIDER_SITE_OTHER): Payer: PRIVATE HEALTH INSURANCE | Admitting: Obstetrics and Gynecology

## 2023-02-08 VITALS — BP 130/84 | HR 101 | Ht 66.0 in | Wt 160.0 lb

## 2023-02-08 DIAGNOSIS — N950 Postmenopausal bleeding: Secondary | ICD-10-CM | POA: Diagnosis not present

## 2023-02-08 NOTE — Progress Notes (Signed)
GYNECOLOGY OFFICE NOTE  History:  62 y.o. G3P0010 here today for 1 episode of bleeding associated with sexual activity. States partner was using a hand in her vulvar area and noted blood, she has some pain. They stopped immediately and there was no penetration at this point. She bled for two hours and then it stopped, has not had any bleeding prior or since. No other issues.  Past Medical History:  Diagnosis Date   ADD (attention deficit disorder)    Bipolar disorder (HCC)    Depression    Elevated testosterone level in female    ETOH abuse    Family history of breast cancer    Family history of colon cancer    Family history of melanoma    Family history of prostate cancer    Family history of throat cancer    Lyme disease    Menopause 02/10/2012   Pernicious anemia    Sinus infection 02/10/2012   Pt reports this as a current problem    Past Surgical History:  Procedure Laterality Date   CESAREAN SECTION     CHOLECYSTECTOMY     KNEE SURGERY     left   LAPAROSCOPIC APPENDECTOMY       Current Outpatient Medications:    buPROPion (WELLBUTRIN XL) 150 MG 24 hr tablet, Take 1 tablet (150 mg total) by mouth daily., Disp: 30 tablet, Rfl: 0   cyanocobalamin (,VITAMIN B-12,) 1000 MCG/ML injection, INJECT 1 ML INTO MUSCLE EVERY 14 DAYS, Disp: 30 mL, Rfl: 0   estradiol (ESTRACE VAGINAL) 0.1 MG/GM vaginal cream, Apply vaginally BIW, Disp: 42.5 g, Rfl: 2   fluticasone (FLONASE) 50 MCG/ACT nasal spray, Place into the nose., Disp: , Rfl:    lamoTRIgine (LAMICTAL) 100 MG tablet, Take one tablet a day (Patient taking differently: 400 mg. Take one tablet a day), Disp: 30 tablet, Rfl: 0   meclizine (ANTIVERT) 25 MG tablet, meclizine 25 mg tablet, Disp: , Rfl:    ondansetron (ZOFRAN-ODT) 4 MG disintegrating tablet, ondansetron 4 mg disintegrating tablet, Disp: , Rfl:    SUMAtriptan (IMITREX) 25 MG tablet, Take by mouth daily., Disp: , Rfl:    ziprasidone (GEODON) 60 MG capsule, TK ONE C PO  QPM. TAKE WITH 350 CALORIES OF FOOD, Disp: , Rfl: 0  The following portions of the patient's history were reviewed and updated as appropriate: allergies, current medications, past family history, past medical history, past social history, past surgical history and problem list.   Review of Systems:  Pertinent items noted in HPI and remainder of comprehensive ROS otherwise negative.   Objective:  Physical Exam BP 130/84   Pulse (!) 101   Ht 5\' 6"  (1.676 m)   Wt 72.6 kg   LMP 08/09/2009   BMI 25.82 kg/m  CONSTITUTIONAL: Well-developed, well-nourished female in no acute distress.  HENT:  Normocephalic, atraumatic. External right and left ear normal. Oropharynx is clear and moist EYES: Conjunctivae and EOM are normal. Pupils are equal, round, and reactive to light. No scleral icterus.  NECK: Normal range of motion, supple, no masses SKIN: Skin is warm and dry. No rash noted. Not diaphoretic. No erythema. No pallor. NEUROLOGIC: Alert and oriented to person, place, and time. Normal reflexes, muscle tone coordination. No cranial nerve deficit noted. PSYCHIATRIC: Normal mood and affect. Normal behavior. Normal judgment and thought content. CARDIOVASCULAR: Normal heart rate noted RESPIRATORY: Effort normal, no problems with respiration noted ABDOMEN: Soft, no distention noted.   PELVIC: Normal appearing external genitalia; normal appearing  vaginal mucosa and cervix.  No abnormal discharge noted. Normal uterine size, no other palpable masses, no uterine or adnexal tenderness. MUSCULOSKELETAL: Normal range of motion. No edema noted.  Exam done with chaperone present.  Labs and Imaging No results found.  Assessment & Plan:  1. Post-menopausal bleeding Likely related only to sexual activity, likely had small laceration although none obvious on exam - reviewed recommendation for workup for postmenopausal bleeding, however since she has had only one brief episode of bleeding with a known cause,  it would be reasonable to watch and wait - reviewed risks/benefits, she would like to proceed with TVUS and EMB if endometrial stripe thickened, reviewed would need EMB if ongoing bleeding regardless of stripe, she verbalizes understanding of this plan and is in agreement - US PELVIC COMPLETE WITH TRANSVAGINAL; Future   Routine preventative health maintenance measures emphasized. Please refer to After Visit Summary for other counseling recommendations.   Return for will contact patient with results for follow up.   Baldemar Lenis, MD, [facility] Attending Center for [employer] [facility])

## 2023-02-21 ENCOUNTER — Other Ambulatory Visit: Payer: PRIVATE HEALTH INSURANCE

## 2023-03-01 ENCOUNTER — Encounter: Payer: Self-pay | Admitting: *Deleted

## 2023-03-02 ENCOUNTER — Encounter: Payer: Self-pay | Admitting: Obstetrics and Gynecology

## 2023-03-02 NOTE — Progress Notes (Signed)
Received patient pelvic US that showed endometrial stripe of 0.6 mm (ordered in setting of one episode of post menopausal bleeding). Given thin stripe < 4 mm, recommend watchful waiting for further episodes of bleeding as agreed upon at last visit. Will send message to patient with recommendation.  Baldemar Lenis, MD, [facility] Attending Center for [employer] [facility])

## 2023-06-22 ENCOUNTER — Encounter: Payer: Self-pay | Admitting: Obstetrics and Gynecology

## 2023-06-22 ENCOUNTER — Ambulatory Visit: Payer: PRIVATE HEALTH INSURANCE | Admitting: Obstetrics and Gynecology

## 2023-06-22 VITALS — BP 128/84 | HR 84 | Ht 66.0 in | Wt 162.0 lb

## 2023-06-22 DIAGNOSIS — L739 Follicular disorder, unspecified: Secondary | ICD-10-CM

## 2023-06-22 DIAGNOSIS — L723 Sebaceous cyst: Secondary | ICD-10-CM | POA: Diagnosis not present

## 2023-06-22 NOTE — Patient Instructions (Addendum)
 Apply a warm washcloth to the area for 20 minutes 3 times a day to help it drain and heal faster  Warning signs: fevers, nausea/vomiting, or the area becomes red, hot and really tender  Try clipping hair instead of shaving/waxing to prevent ingrown hairs or infection  Please don't forget to schedule your pelvic ultrasound!

## 2023-06-22 NOTE — Progress Notes (Signed)
   RETURN GYNECOLOGY VISIT  Subjective:  [name] is a menopausal 63 y.o. presenting for vulvar lump  Present x 1 week. Was initially quite large and painful, but has been improving with warm compresses. No drainage. No nausea, vomiting, fevers, chills, abnormal discharge/bleeding.   Gets ingrown hairs frequently. Used to wax but shaved more recently.   Objective:   Vitals:   06/22/23 0903  BP: 128/84  Pulse: 84  Weight: 162 lb (73.5 kg)  Height: 5' 6 (1.676 m)   General:  Alert, oriented and cooperative. Patient is in no acute distress.  Skin: Skin is warm and dry. No rash noted.   Cardiovascular: Normal heart rate noted  Respiratory: Normal respiratory effort, no problems with respiration noted  Abdomen: Soft, non-tender, non-distended   Pelvic: 1cm nodule on left labia majora around the same level as the clitoris. Mildly erythematous, but firm without drainage, fluctuance, warmth or tenderness. No pocket of fluid to suggest abscess. Likely sebaceous cyst vs folliculitis   Exam performed in the presence of a chaperone  Assessment and Plan:  [name] is a 63 y.o. with sebaceous cyst vs folliculitis  No e/o active infection, cellulitis or abscess on exam today. Defer abx Recommend warm compresses TID, avoid picking/scratching Discussed clipping hair rather than shaving/waxing to minimize risk of recurrence Follow up prn or urgently if area becomes red/hot/tender or if she develops fevers, nausea/vomiting  Return if symptoms worsen or fail to improve.  Kieth JAYSON Carolin, MD

## 2023-07-05 ENCOUNTER — Telehealth: Payer: Self-pay | Admitting: *Deleted

## 2023-07-05 NOTE — Telephone Encounter (Signed)
 Left patient a message to call and schedule annual that is due after 07/31/2023.

## 2023-07-20 ENCOUNTER — Telehealth: Payer: Self-pay | Admitting: *Deleted

## 2023-07-20 NOTE — Telephone Encounter (Signed)
Returned call from 07/19/2023 at 3:39 PM. Left patient a message to call and schedule annual.

## 2023-09-13 ENCOUNTER — Telehealth: Payer: Self-pay | Admitting: *Deleted

## 2023-09-13 NOTE — Telephone Encounter (Signed)
 Returned call from 09/12/2023 at 2:56 PM. Left patient a message to call and schedule.

## 2023-10-10 ENCOUNTER — Other Ambulatory Visit (HOSPITAL_COMMUNITY)
Admission: RE | Admit: 2023-10-10 | Discharge: 2023-10-10 | Disposition: A | Discharge status: Home / Self Care | Payer: PRIVATE HEALTH INSURANCE | Source: Ambulatory Visit | Attending: Obstetrics and Gynecology | Admitting: Obstetrics and Gynecology

## 2023-10-10 ENCOUNTER — Other Ambulatory Visit: Payer: Self-pay | Admitting: *Deleted

## 2023-10-10 ENCOUNTER — Encounter: Payer: Self-pay | Admitting: Obstetrics and Gynecology

## 2023-10-10 ENCOUNTER — Ambulatory Visit: Payer: PRIVATE HEALTH INSURANCE | Admitting: Obstetrics and Gynecology

## 2023-10-10 VITALS — BP 122/82 | HR 70 | Wt 159.1 lb

## 2023-10-10 DIAGNOSIS — Z01419 Encounter for gynecological examination (general) (routine) without abnormal findings: Secondary | ICD-10-CM | POA: Diagnosis present

## 2023-10-10 DIAGNOSIS — N950 Postmenopausal bleeding: Secondary | ICD-10-CM

## 2023-10-10 NOTE — Progress Notes (Signed)
 GYNECOLOGY ANNUAL PREVENTATIVE CARE ENCOUNTER NOTE  History:     [name] is a 63 y.o. G73P0010 female here for a routine annual gynecologic exam.  Current complaints: fatigue, and worsening depression symptoms.   Denies abnormal vaginal bleeding, discharge, pelvic pain, problems with intercourse or other gynecologic concerns.    Recent worsening ADHD symptoms and depression symptoms. Recently placed on Strattera   Seeing psychiatrist regularly Having extreme fatigue. Would like to talk with psychiatrist first before considering further GYN labs. Would consider hormone therapy as she is concerned her fatigue is related to postmenopausal symptoms.   Currently taking vaginal estrace  for dryness. She is not sexually active at this time.   Gynecologic History Patient's last menstrual period was 08/09/2009. Contraception: post menopausal status Last Pap: 2022. Result was normal with negative HPV Last Mammogram: done 02/20/2023.  Result was normal Has this done in [place] hospital. This is UP to date and available in care everywhere.  Last Colonoscopy: 2023.  Result normal, will go every 5 years   Obstetric History OB History  Gravida Para Term Preterm AB Living  3 2   1    SAB IAB Ectopic Multiple Live Births  1        # Outcome Date GA Lbr Len/2nd Weight Sex Type Anes PTL Lv  3 SAB           2 Para           1 Para             Past Medical History:  Diagnosis Date   ADD (attention deficit disorder)    Bipolar disorder (HCC)    Depression    Elevated testosterone level in female    ETOH abuse    Family history of breast cancer    Family history of colon cancer    Family history of melanoma    Family history of prostate cancer    Family history of throat cancer    Lyme disease    Menopause 02/10/2012   Pernicious anemia    Sinus infection 02/10/2012   Pt reports this as a current problem    Past Surgical History:  Procedure Laterality Date   CESAREAN  SECTION     CHOLECYSTECTOMY     KNEE SURGERY     left   LAPAROSCOPIC APPENDECTOMY      Current Outpatient Medications on File Prior to Visit  Medication Sig Dispense Refill   atomoxetine (STRATTERA) 25 MG capsule Take 25 mg by mouth daily.     cyanocobalamin  (,VITAMIN B-12,) 1000 MCG/ML injection INJECT 1 ML INTO MUSCLE EVERY 14 DAYS 30 mL 0   estradiol  (ESTRACE  VAGINAL) 0.1 MG/GM vaginal cream Apply vaginally BIW 42.5 g 2   fluticasone (FLONASE) 50 MCG/ACT nasal spray Place into the nose.     lamoTRIgine  (LAMICTAL ) 100 MG tablet Take one tablet a day (Patient taking differently: 300 mg. Take one tablet a day) 30 tablet 0   meclizine (ANTIVERT) 25 MG tablet meclizine 25 mg tablet     ondansetron  (ZOFRAN -ODT) 4 MG disintegrating tablet ondansetron  4 mg disintegrating tablet     ziprasidone (GEODON) 60 MG capsule TK ONE C PO QPM. TAKE WITH 350 CALORIES OF FOOD  0   buPROPion  (WELLBUTRIN  XL) 150 MG 24 hr tablet Take 1 tablet (150 mg total) by mouth daily. (Patient not taking: Reported on 10/10/2023) 30 tablet 0   SUMAtriptan (IMITREX) 25 MG tablet Take by mouth daily. (Patient not taking: Reported on  10/10/2023)     No current facility-administered medications on file prior to visit.    Allergies  Allergen Reactions   Penicillins Anaphylaxis and Other (See Comments)    Other reaction(s): Anaphylaxis  Other reaction(s): Anaphylaxis Other reaction(s): Anaphylaxis Other reaction(s): Anaphylaxis    Other reaction(s): Anaphylaxis    Other reaction(s): Anaphylaxis Other reaction(s): Anaphylaxis Other reaction(s): Anaphylaxis Other reaction(s): Anaphylaxis  Other reaction(s): Anaphylaxis  Other reaction(s): Anaphylaxis  Other reaction(s): Anaphylaxis  Other reaction(s): Anaphylaxis   Haemophilus B Polysaccharide Vaccine Rash and Other (See Comments)    "severe flu symptoms"  Other reaction(s): Other (See Comments)  "severe flu symptoms" "severe flu symptoms" Other reaction(s): Other  (See Comments) "severe flu symptoms"    "severe flu symptoms" "severe flu symptoms" Other reaction(s): Other (See Comments) "severe flu symptoms"  "severe flu symptoms" "severe flu symptoms" Other reaction(s): Other (See Comments) "severe flu symptoms"   Promethazine Other (See Comments)    Arms and legs start to moves  Other reaction(s): Other (See Comments)  Arms and legs start to moves Arms and legs start to moves Arms and legs start to moves Arms and legs start to moves Other reaction(s): Other (See Comments) Arms and legs start to moves Arms and legs start to moves Other reaction(s): Other (See Comments) Arms and legs start to moves Arms and legs start to moves  Other reaction(s): Other (See Comments) Arms and legs start to moves Arms and legs start to moves Other reaction(s): Other (See Comments) Arms and legs start to moves Arms and legs start to moves    Arms and legs start to moves    Arms and legs start to moves Arms and legs start to moves Other reaction(s): Other (See Comments) Arms and legs start to moves Arms and legs start to moves    Other reaction(s): Other (See Comments) Arms and legs start to moves Arms and legs start to moves  Other reaction(s): Other (See Comments) Arms and legs start to moves Arms and legs start to moves Other reaction(s): Other (See Comments) Arms and legs start to moves Arms and legs start to moves Arms and legs start to moves Arms and legs start to moves Arms and legs start to moves Arms and legs start to moves Other reaction(s): Other (See Comments) Arms and legs start to moves Arms and legs start to moves Other reaction(s): Other (See Comments) Arms and legs start to moves Arms and legs start to moves  Other reaction(s): Other (See Comments) Arms and legs start to moves Arms and legs start to moves Other reaction(s): Other (See Comments) Arms and legs start to moves Arms and legs start to moves  Other reaction(s): Not available  Other reaction(s): Other  (See Comments)  Arms and legs start to moves  Arms and legs start to moves    Other reaction(s): Other (See Comments)  Arms and legs start to moves  Arms and legs start to moves  Other reaction(s): Other (See Comments)  Arms and legs start to moves  Arms and legs start to moves  Arms and legs start to moves  Arms and legs start to moves  Arms and legs start to moves  Arms and legs start to moves  Other reaction(s): Other (See Comments)  Arms and legs start to moves  Arms and legs start to moves  Other reaction(s): Other (See Comments)  Arms and legs start to moves  Arms and legs start to moves    Other reaction(s): Other (See  Comments)  Arms and legs start to moves  Arms and legs start to moves  Other reaction(s): Other (See Comments)  Arms and legs start to moves  Arms and legs start to moves    Other reaction(s): Not available   Influenza Vac Split Quad     Other reaction(s): Other (See Comments) "severe flu symptoms" "severe flu symptoms" Other reaction(s): Other (See Comments) "severe flu symptoms"   Influenza Vaccines Other (See Comments)   Influenza Virus Vaccine Other (See Comments)    "severe flu symptoms" "severe flu symptoms" Other reaction(s): Other (See Comments) "severe flu symptoms"    Other Other (See Comments)    Other reaction(s): Other (See Comments)  "severe flu symptoms"   Phenergan  [Promethazine Hcl]     Social History:  reports that she has quit smoking. Her smoking use included cigarettes. She has never used smokeless tobacco. She reports current alcohol  use. She reports that she does not use drugs.  Family History  Problem Relation Age of Onset   Prostate cancer Father 52   Melanoma Father        several removed over last 10 yrs   Breast cancer Sister 21   Alcohol  abuse Maternal Uncle    Cancer Maternal Uncle        Colon-dx 65   Throat cancer Maternal Uncle    Alcohol  abuse Maternal Grandfather    Cancer Maternal Grandfather         colon- dx 26 d. 50   Depression Son    Throat cancer Maternal Grandmother    Breast cancer Paternal Grandmother 34   Heart disease Neg Hx    Hyperlipidemia Neg Hx    Hypertension Neg Hx    Stroke Neg Hx     The following portions of the patient's history were reviewed and updated as appropriate: allergies, current medications, past family history, past medical history, past social history, past surgical history and problem list.  Review of Systems Pertinent items noted in HPI and remainder of comprehensive ROS otherwise negative.  Physical Exam:  BP 122/82   Pulse 70   Wt 159 lb 1.9 oz (72.2 kg)   LMP 08/09/2009   BMI 25.68 kg/m  CONSTITUTIONAL: Well-developed, well-nourished female in no acute distress.  HENT:  Normocephalic, atraumatic, External right and left ear normal.  EYES: Conjunctivae and EOM are normal. Pupils are equal, round, and reactive to light. No scleral icterus.  NECK: Normal range of motion, supple, no masses observed. SKIN: Skin is warm and dry. No rash noted. Not diaphoretic. No erythema. No pallor. MUSCULOSKELETAL: Normal range of motion. No tenderness.  No cyanosis, clubbing, or edema. NEUROLOGIC: Alert and oriented to person, place, and time. Normal muscle tone coordination.  PSYCHIATRIC: Normal mood and affect. Normal behavior. Normal judgment and thought content. CARDIOVASCULAR: Normal heart rate noted, regular rhythm RESPIRATORY: Clear to auscultation bilaterally. Effort and breath sounds normal, no problems with respiration noted. BREASTS: Symmetric in size. No masses, tenderness, skin changes, nipple drainage, or lymphadenopathy bilaterally. Performed in the presence of a chaperone. ABDOMEN: Soft, no distention noted.  No tenderness, rebound or guarding.  PELVIC: Normal appearing external genitalia and urethral meatus; normal appearing vaginal mucosa and cervix.  No abnormal vaginal discharge noted.  Pap smear obtained.  Normal uterine size, no other  palpable masses, no uterine or adnexal tenderness.  Performed in the presence of a chaperone.   Assessment and Plan:    1. Women's annual routine gynecological examination (Primary)  -Cytology - PAP(  [facility])  -Schedule with GYN to discuss HRT due to extreme fatigue, vaginal dryness and worsening depression.  - Declines STI testing, not currently sexually active -Keep close f/u with psychiatrist.   Will follow up results of pap smear and manage accordingly. Normal breast examination today, she was advised to perform periodic self breast examinations.   Mammogram up to date for breast cancer screening. Colon cancer screening is up to date Routine preventative health maintenance measures emphasized. Please refer to After Visit Summary for other counseling recommendations.    Serjio Deupree, Juliette Oh, NP [facility] for [employer], [facility]

## 2023-10-12 LAB — CYTOLOGY - PAP
Adequacy: ABSENT
Comment: NEGATIVE
Diagnosis: NEGATIVE
High risk HPV: NEGATIVE

## 2023-11-29 ENCOUNTER — Ambulatory Visit: Payer: PRIVATE HEALTH INSURANCE | Admitting: Obstetrics and Gynecology

## 2024-04-09 ENCOUNTER — Telehealth: Payer: Self-pay

## 2024-04-09 NOTE — Telephone Encounter (Signed)
 RN returned patient call and assisted with scheduling with provider on 04/25/24 at 2:35pm to discuss HRT.  Silvano LELON Piano, RN

## 2024-04-09 NOTE — Telephone Encounter (Signed)
 [facility] staff left voicemail stating pt is requesting refill for Estradiol  vaginal cream.

## 2024-04-22 ENCOUNTER — Telehealth: Payer: Self-pay

## 2024-04-22 NOTE — Telephone Encounter (Addendum)
 RN attempted to call patient to review recommendations by Dr. Cleatus. RN left HIPAA compliant voicemail to return RN call.  Silvano LELON Piano, RN  RN spoke with patient and discussed recommendations from Dr. Cleatus. Pt reported would like refill of Estradiol  Vaginal Cream. Pt reported would check with PCP first.  Silvano LELON Piano, RN

## 2024-04-25 ENCOUNTER — Ambulatory Visit: Payer: PRIVATE HEALTH INSURANCE | Admitting: Obstetrics and Gynecology
